# Patient Record
Sex: Female | Born: 1945 | Race: White | Hispanic: No | Smoking: Never smoker
Health system: Southern US, Community
[De-identification: ages and names within clinical notes are randomized; demographics above are authoritative.]

## PROBLEM LIST (undated history)

## (undated) DIAGNOSIS — M1990 Unspecified osteoarthritis, unspecified site: Secondary | ICD-10-CM

## (undated) DIAGNOSIS — F0390 Unspecified dementia without behavioral disturbance: Secondary | ICD-10-CM

## (undated) DIAGNOSIS — I10 Essential (primary) hypertension: Secondary | ICD-10-CM

## (undated) DIAGNOSIS — E079 Disorder of thyroid, unspecified: Secondary | ICD-10-CM

## (undated) DIAGNOSIS — E119 Type 2 diabetes mellitus without complications: Secondary | ICD-10-CM

## (undated) HISTORY — PX: CHOLECYSTECTOMY: SHX55

## (undated) HISTORY — PX: ABDOMINAL HYSTERECTOMY: SHX81

---

## 2014-02-05 ENCOUNTER — Other Ambulatory Visit: Payer: Self-pay

## 2014-02-05 MED ORDER — ALPRAZOLAM 0.25 MG PO TABS: ORAL_TABLET | ORAL | Status: DC

## 2014-02-05 NOTE — Telephone Encounter (Signed)
RX sent to [facility] @ [phone]. Phone number [phone]  

## 2014-02-08 ENCOUNTER — Non-Acute Institutional Stay (SKILLED_NURSING_FACILITY): Payer: Medicare Other | Admitting: Internal Medicine

## 2014-02-08 DIAGNOSIS — R945 Abnormal results of liver function studies: Secondary | ICD-10-CM

## 2014-02-08 DIAGNOSIS — S2220XA Unspecified fracture of sternum, initial encounter for closed fracture: Secondary | ICD-10-CM

## 2014-02-08 DIAGNOSIS — S2249XA Multiple fractures of ribs, unspecified side, initial encounter for closed fracture: Secondary | ICD-10-CM

## 2014-02-08 DIAGNOSIS — F259 Schizoaffective disorder, unspecified: Secondary | ICD-10-CM

## 2014-02-08 DIAGNOSIS — S27329A Contusion of lung, unspecified, initial encounter: Secondary | ICD-10-CM

## 2014-02-08 DIAGNOSIS — R7989 Other specified abnormal findings of blood chemistry: Secondary | ICD-10-CM
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
## 2014-02-13 DIAGNOSIS — S2249XA Multiple fractures of ribs, unspecified side, initial encounter for closed fracture: Secondary | ICD-10-CM | POA: Insufficient documentation

## 2014-02-13 DIAGNOSIS — R7989 Other specified abnormal findings of blood chemistry: Secondary | ICD-10-CM | POA: Insufficient documentation

## 2014-02-13 DIAGNOSIS — S27329A Contusion of lung, unspecified, initial encounter: Secondary | ICD-10-CM

## 2014-02-13 DIAGNOSIS — R945 Abnormal results of liver function studies: Secondary | ICD-10-CM | POA: Insufficient documentation

## 2014-02-13 DIAGNOSIS — F259 Schizoaffective disorder, unspecified: Secondary | ICD-10-CM | POA: Insufficient documentation

## 2014-02-13 DIAGNOSIS — S2220XA Unspecified fracture of sternum, initial encounter for closed fracture: Secondary | ICD-10-CM | POA: Insufficient documentation

## 2014-02-13 HISTORY — DX: Multiple fractures of ribs, unspecified side, initial encounter for closed fracture: S22.49XA

## 2014-02-13 HISTORY — DX: Contusion of lung, unspecified, initial encounter: S27.329A

## 2014-02-15 ENCOUNTER — Non-Acute Institutional Stay (SKILLED_NURSING_FACILITY): Payer: Medicare Other | Admitting: Internal Medicine

## 2014-02-15 DIAGNOSIS — E876 Hypokalemia: Secondary | ICD-10-CM

## 2014-02-17 ENCOUNTER — Non-Acute Institutional Stay (SKILLED_NURSING_FACILITY): Payer: Medicare Other | Admitting: Internal Medicine

## 2014-02-17 DIAGNOSIS — F259 Schizoaffective disorder, unspecified: Secondary | ICD-10-CM

## 2014-02-17 DIAGNOSIS — I10 Essential (primary) hypertension: Secondary | ICD-10-CM

## 2014-02-17 DIAGNOSIS — R945 Abnormal results of liver function studies: Secondary | ICD-10-CM

## 2014-02-17 DIAGNOSIS — E876 Hypokalemia: Secondary | ICD-10-CM

## 2014-02-17 DIAGNOSIS — R7989 Other specified abnormal findings of blood chemistry: Secondary | ICD-10-CM

## 2014-02-17 NOTE — Progress Notes (Signed)
Patient ID: Marie Sandoval, female   DOB: Oct 23, 1946, 68 y.o.   MRN: [id number] Of note-will add hemoglobin A1c to lab work since she is on Seroquel with what appears at times somewhat elevated glucose on metabolic panel most recently 129 and 148  Also will order a BMP and magnesium level next week may be drawn by home health and primary care provider notified of the results
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
## 2014-02-22 DIAGNOSIS — F209 Schizophrenia, unspecified: Secondary | ICD-10-CM

## 2014-02-22 DIAGNOSIS — R0602 Shortness of breath: Secondary | ICD-10-CM

## 2014-02-22 DIAGNOSIS — S2220XA Unspecified fracture of sternum, initial encounter for closed fracture: Secondary | ICD-10-CM

## 2014-02-22 DIAGNOSIS — N189 Chronic kidney disease, unspecified: Secondary | ICD-10-CM

## 2014-02-22 DIAGNOSIS — S2249XA Multiple fractures of ribs, unspecified side, initial encounter for closed fracture: Secondary | ICD-10-CM

## 2014-03-08 DIAGNOSIS — M797 Fibromyalgia: Secondary | ICD-10-CM | POA: Diagnosis present

## 2014-03-08 DIAGNOSIS — N1832 Chronic kidney disease, stage 3b: Secondary | ICD-10-CM | POA: Diagnosis present

## 2014-03-08 DIAGNOSIS — G2581 Restless legs syndrome: Secondary | ICD-10-CM | POA: Diagnosis present

## 2014-03-08 DIAGNOSIS — E039 Hypothyroidism, unspecified: Secondary | ICD-10-CM | POA: Diagnosis present

## 2014-03-08 DIAGNOSIS — J45909 Unspecified asthma, uncomplicated: Secondary | ICD-10-CM | POA: Diagnosis present

## 2014-03-08 DIAGNOSIS — K219 Gastro-esophageal reflux disease without esophagitis: Secondary | ICD-10-CM | POA: Diagnosis present

## 2014-03-08 DIAGNOSIS — G43909 Migraine, unspecified, not intractable, without status migrainosus: Secondary | ICD-10-CM | POA: Diagnosis present

## 2017-11-26 DIAGNOSIS — E1342 Other specified diabetes mellitus with diabetic polyneuropathy: Secondary | ICD-10-CM | POA: Diagnosis present

## 2018-08-18 DIAGNOSIS — G8929 Other chronic pain: Secondary | ICD-10-CM | POA: Diagnosis present

## 2021-01-16 ENCOUNTER — Emergency Department (HOSPITAL_BASED_OUTPATIENT_CLINIC_OR_DEPARTMENT_OTHER): Payer: Medicare HMO

## 2021-01-16 ENCOUNTER — Emergency Department (HOSPITAL_BASED_OUTPATIENT_CLINIC_OR_DEPARTMENT_OTHER)
Admission: EM | Admit: 2021-01-16 | Discharge: 2021-01-16 | Disposition: A | Discharge status: Home / Self Care | Payer: Medicare HMO | Attending: Emergency Medicine | Admitting: Emergency Medicine

## 2021-01-16 ENCOUNTER — Other Ambulatory Visit: Payer: Self-pay

## 2021-01-16 ENCOUNTER — Encounter (HOSPITAL_BASED_OUTPATIENT_CLINIC_OR_DEPARTMENT_OTHER): Payer: Self-pay | Admitting: *Deleted

## 2021-01-16 DIAGNOSIS — E1122 Type 2 diabetes mellitus with diabetic chronic kidney disease: Secondary | ICD-10-CM | POA: Diagnosis not present

## 2021-01-16 DIAGNOSIS — R21 Rash and other nonspecific skin eruption: Secondary | ICD-10-CM | POA: Insufficient documentation

## 2021-01-16 DIAGNOSIS — R600 Localized edema: Secondary | ICD-10-CM

## 2021-01-16 DIAGNOSIS — I129 Hypertensive chronic kidney disease with stage 1 through stage 4 chronic kidney disease, or unspecified chronic kidney disease: Secondary | ICD-10-CM | POA: Diagnosis not present

## 2021-01-16 DIAGNOSIS — Z7984 Long term (current) use of oral hypoglycemic drugs: Secondary | ICD-10-CM | POA: Insufficient documentation

## 2021-01-16 DIAGNOSIS — N189 Chronic kidney disease, unspecified: Secondary | ICD-10-CM | POA: Diagnosis not present

## 2021-01-16 DIAGNOSIS — R609 Edema, unspecified: Secondary | ICD-10-CM

## 2021-01-16 DIAGNOSIS — Z79899 Other long term (current) drug therapy: Secondary | ICD-10-CM | POA: Insufficient documentation

## 2021-01-16 HISTORY — DX: Unspecified osteoarthritis, unspecified site: M19.90

## 2021-01-16 HISTORY — DX: Type 2 diabetes mellitus without complications: E11.9

## 2021-01-16 HISTORY — DX: Essential (primary) hypertension: I10

## 2021-01-16 HISTORY — DX: Disorder of thyroid, unspecified: E07.9

## 2021-01-16 LAB — CBC WITH DIFFERENTIAL/PLATELET
Abs Immature Granulocytes: 0.04 10*3/uL (ref 0.00–0.07)
Basophils Absolute: 0.1 10*3/uL (ref 0.0–0.1)
Basophils Relative: 1 %
Eosinophils Absolute: 0.2 10*3/uL (ref 0.0–0.5)
Eosinophils Relative: 3 %
HCT: 35.5 % — ABNORMAL LOW (ref 36.0–46.0)
Hemoglobin: 11.9 g/dL — ABNORMAL LOW (ref 12.0–15.0)
Immature Granulocytes: 1 %
Lymphocytes Relative: 29 %
Lymphs Abs: 1.4 10*3/uL (ref 0.7–4.0)
MCH: 29.9 pg (ref 26.0–34.0)
MCHC: 33.5 g/dL (ref 30.0–36.0)
MCV: 89.2 fL (ref 80.0–100.0)
Monocytes Absolute: 0.5 10*3/uL (ref 0.1–1.0)
Monocytes Relative: 10 %
Neutro Abs: 2.7 10*3/uL (ref 1.7–7.7)
Neutrophils Relative %: 56 %
Platelets: 169 10*3/uL (ref 150–400)
RBC: 3.98 MIL/uL (ref 3.87–5.11)
RDW: 15.9 % — ABNORMAL HIGH (ref 11.5–15.5)
WBC: 4.9 10*3/uL (ref 4.0–10.5)
nRBC: 0 % (ref 0.0–0.2)

## 2021-01-16 LAB — COMPREHENSIVE METABOLIC PANEL
ALT: 18 U/L (ref 0–44)
AST: 69 U/L — ABNORMAL HIGH (ref 15–41)
Albumin: 3.6 g/dL (ref 3.5–5.0)
Alkaline Phosphatase: 81 U/L (ref 38–126)
Anion gap: 8 (ref 5–15)
BUN: 14 mg/dL (ref 8–23)
CO2: 24 mmol/L (ref 22–32)
Calcium: 8.9 mg/dL (ref 8.9–10.3)
Chloride: 107 mmol/L (ref 98–111)
Creatinine, Ser: 1.68 mg/dL — ABNORMAL HIGH (ref 0.44–1.00)
GFR, Estimated: 32 mL/min — ABNORMAL LOW (ref >60)
Glucose, Bld: 105 mg/dL — ABNORMAL HIGH (ref 70–99)
Potassium: 4 mmol/L (ref 3.5–5.1)
Sodium: 139 mmol/L (ref 135–145)
Total Bilirubin: 0.4 mg/dL (ref 0.3–1.2)
Total Protein: 6.3 g/dL — ABNORMAL LOW (ref 6.5–8.1)

## 2021-01-16 MED ORDER — ACETAMINOPHEN 325 MG PO TABS
650.0000 mg | ORAL_TABLET | Freq: Once | ORAL | Status: AC
  Administered 2021-01-16: 650 mg via ORAL
  Filled 2021-01-16: qty 2

## 2021-01-16 NOTE — ED Triage Notes (Signed)
Swelling to her left foot and ankle. Seen by her MD and told to come here to r.o DVT.

## 2021-01-16 NOTE — ED Notes (Signed)
Patient transported to Ultrasound 

## 2021-01-16 NOTE — ED Provider Notes (Signed)
[facility] EMERGENCY DEPARTMENT Provider Note  CSN: [id number] Arrival date & time: 01/16/21 1618    History Chief Complaint  Patient presents with  . Leg Pain    HPI  Marie Sandoval is a 75 y.o. female sent to the ED from PCP office for evaluation of leg swelling. She first noticed some swelling of her LLE last week, about 4-5 days ago. Started in the foot and gradually extended into her calf. She denies any significant pain but has noticed some red splotches on the leg. She has not had any other symptoms including fever, CP, SOB, N/V/D. She has noted that she bruises easily but is not taking any blood thinners. She does not have bleeding gums or nosebleeds.    Past Medical History:  Diagnosis Date  . Arthritis   . Diabetes mellitus without complication (HCC)   . Hypertension   . Thyroid disease     Past Surgical History:  Procedure Laterality Date  . ABDOMINAL HYSTERECTOMY    . CHOLECYSTECTOMY      No family history on file.  Social History   Tobacco Use  . Smoking status: Never Smoker  . Smokeless tobacco: Never Used  Substance Use Topics  . Alcohol use: Never  . Drug use: Never     Home Medications Prior to Admission medications   Medication Sig Start Date End Date Taking? Authorizing Provider  amLODipine (NORVASC) 5 MG tablet Take 1 tablet by mouth daily. 12/21/20  Yes [provider]  azelastine (OPTIVAR) 0.05 % ophthalmic solution INSTILL 1 DROP INTO EACH EYE TWICE DAILY 08/09/15  Yes [provider]  benzonatate (TESSALON) 100 MG capsule Take 1 capsule by mouth three times daily as needed for cough 11/02/16  Yes [provider]  buPROPion (WELLBUTRIN XL) 150 MG 24 hr tablet Take 1 tablet by mouth every morning. 04/25/20  Yes [provider]  cariprazine (VRAYLAR) capsule Take 1 capsule by mouth every morning. 01/03/21  Yes [provider]  fluticasone (FLONASE) 50 MCG/ACT nasal spray 2 sprays by Each Nare  route 2 times daily. 01/25/17  Yes [provider]  glipiZIDE (GLUCOTROL) 10 MG tablet Take 1 tablet by mouth daily. 02/07/17  Yes [provider]  hydrOXYzine (ATARAX/VISTARIL) 50 MG tablet Take 1 tablet by mouth daily. 08/11/18  Yes [provider]  levothyroxine (SYNTHROID) 75 MCG tablet TAKE 1 TABLET BY MOUTH ONCE DAILY AT 6 AM 10/10/16  Yes [provider]  linagliptin (TRADJENTA) 5 MG TABS tablet Take 1 tablet by mouth daily. 12/21/20  Yes [provider]  lisinopril (ZESTRIL) 5 MG tablet Take 1 tablet by mouth daily. 10/10/16  Yes [provider]  LORazepam (ATIVAN) 0.5 MG tablet 0.5 mg. Take 1 mg in morning.  Take 0.5 mg in evening. 08/15/20  Yes [provider]  metoprolol tartrate (LOPRESSOR) 25 MG tablet Take by mouth. 10/10/16  Yes [provider]  nystatin ointment (MYCOSTATIN) APPLY LIBERALLY TOPICALLY TO AFFECTED AREA FOUR TO FIVE TIMES DAILY 11/09/19  Yes [provider]  omeprazole (PRILOSEC) 40 MG capsule Take 1 capsule by mouth daily. 01/09/21  Yes [provider]  temazepam (RESTORIL) 15 MG capsule Take 1 capsule by mouth at bedtime. 04/26/20  Yes [provider]  traZODone (DESYREL) 50 MG tablet Take 1 tablet by mouth at bedtime. 06/05/20  Yes [provider]  ALPRAZolam Prudy Feeler) 0.25 MG tablet 1 tablet by mouth three times daily as needed x 2 days (use for xanax) 02/05/14  Krell, Claudette T, NP  cetirizine (ZYRTEC) 10 MG tablet Take by mouth.    [provider]  Multiple Vitamin (MULTIVITAMIN) capsule Take 1 capsule by mouth daily with breakfast.    [provider]  Probiotic, Lactobacillus, CAPS Take by mouth.    [provider]     Allergies    Buprenorphine, Gabapentin, Other, Oxycodone-aspirin, and Sulfamethoxazole   Review of Systems   Review of Systems A comprehensive review of systems was completed and negative except as noted in HPI.     Physical Exam BP 113/65   Pulse 65   Temp 98.3 F (36.8 C) (Oral)   Resp 20   Ht 5\' 7"  (1.702 m)   Wt 80.7 kg   SpO2 98%   BMI 27.88 kg/m   Physical Exam Vitals and nursing note reviewed.  Constitutional:      Appearance: Normal appearance.  HENT:     Head: Normocephalic and atraumatic.     Nose: Nose normal.     Mouth/Throat:     Mouth: Mucous membranes are moist.  Eyes:     Extraocular Movements: Extraocular movements intact.     Conjunctiva/sclera: Conjunctivae normal.  Cardiovascular:     Rate and Rhythm: Normal rate.  Pulmonary:     Effort: Pulmonary effort is normal.     Breath sounds: Normal breath sounds.  Abdominal:     General: Abdomen is flat.     Palpations: Abdomen is soft.     Tenderness: There is no abdominal tenderness.  Musculoskeletal:        General: Normal range of motion.     Cervical back: Neck supple.     Left lower leg: Edema present.     Comments: 1+ edema of LLE to the mid-shin  Skin:    General: Skin is warm and dry.     Comments: Occasional well demarkated, red macular rash, non-blanching on BLE and BUE.   Neurological:     General: No focal deficit present.     Mental Status: She is alert.  Psychiatric:        Mood and Affect: Mood normal.      ED Results / Procedures / Treatments   Labs (all labs ordered are listed, but only abnormal results are displayed) Labs Reviewed  COMPREHENSIVE METABOLIC PANEL - Abnormal; Notable for the following components:      Result Value   Glucose, Bld 105 (*)    Creatinine, Ser 1.68 (*)    Total Protein 6.3 (*)    AST 69 (*)    GFR, Estimated 32 (*)    All other components within normal limits  CBC WITH DIFFERENTIAL/PLATELET - Abnormal; Notable for the following components:   Hemoglobin 11.9 (*)    HCT 35.5 (*)    RDW 15.9 (*)    All other components within normal limits    EKG None   Radiology Venous Img Lower  Left (DVT Study)  Result Date: 01/16/2021 CLINICAL DATA:   Left lower extremity swelling EXAM: LEFT LOWER EXTREMITY VENOUS DOPPLER ULTRASOUND TECHNIQUE: Gray-scale sonography with compression, as well as color and duplex ultrasound, were performed to evaluate the deep venous system(s) from the level of the common femoral vein through the popliteal and proximal calf veins. COMPARISON:  None. FINDINGS: VENOUS Normal compressibility of the common femoral, superficial femoral, and popliteal veins, as well as the visualized calf veins. Visualized portions of profunda femoral vein and great saphenous vein unremarkable. No filling defects to suggest DVT  on grayscale or color Doppler imaging. Doppler waveforms show normal direction of venous flow, normal respiratory plasticity and response to augmentation. Limited views of the contralateral common femoral vein are unremarkable. OTHER None. Limitations: none IMPRESSION: Negative. Electronically Signed   By: Charlett Nose M.D.   On: 01/16/2021 18:42    Procedures Procedures  Medications Ordered in the ED Medications  acetaminophen (TYLENOL) tablet 650 mg (650 mg Oral Given 01/16/21 1911)     MDM Rules/Calculators/A&P MDM Patient with unilateral LE edema, sent to rule out DVT. Doppler was neg. Will check labs to see if her CKD has worsened or if she is thrombocytopenic to explain her bruising. Patient requesting APAP for headache.   ED Course  I have reviewed the triage vital signs and the nursing notes.  Pertinent labs & imaging results that were available during my care of the patient were reviewed by me and considered in my medical decision making (see chart for details).  Clinical Course as of 01/16/21 2040  Mon Jan 16, 2021  1927 CBC is unremarkable. Normal platelet count.  [CS]  2038 CMP at baseline. No acute cause of peripheral edema found. Recommend outpatient nephrology or PCP follow up for further management.  She does not tolerate wearing compression stockings.  [CS]    Clinical Course User  Index [CS] Pollyann Savoy, MD    Final Clinical Impression(s) / ED Diagnoses Final diagnoses:  Peripheral edema    Rx / DC Orders ED Discharge Orders    None       Pollyann Savoy, MD 01/16/21 2040

## 2021-03-11 DIAGNOSIS — F419 Anxiety disorder, unspecified: Secondary | ICD-10-CM | POA: Diagnosis present

## 2021-03-12 DIAGNOSIS — G2571 Drug induced akathisia: Secondary | ICD-10-CM | POA: Insufficient documentation

## 2021-03-13 DIAGNOSIS — E119 Type 2 diabetes mellitus without complications: Secondary | ICD-10-CM

## 2021-03-13 DIAGNOSIS — J449 Chronic obstructive pulmonary disease, unspecified: Secondary | ICD-10-CM | POA: Diagnosis present

## 2021-03-14 NOTE — Progress Notes (Signed)
 [facility] HEALTH [facility] Physical Therapy - Initial Evaluation  Patient Name:  Marie Sandoval Date of Birth:  1946/08/09  Today's Date: Mar 14, 2021  Assessment / Plan   Clinical Assessment: Pt admitted to [facility] with previous history of anxiety disorder, antipsychotic induced akathisia, and schizoaffective disorder. Pt was apparently living alone, uses quad cane or walker for amb. Pt presenlty not using the quad cane correctly so PT instructed pt to use RW for all ambulation and RW was issued to pt to use while [facility]. Pt's gait was steady with RW, pt only req'd occassional cues to increase bilat heel strike and foot clearance. Pt has no antalgic gait and only reported pain when asked by PT. Pt c/o mild pain L hip with sit to stand. Pt may benefit from PT for instruction in HEP for L hip/ low back to maximize functional mobility Current Level of Assistance required for Functional Mobility: Supervision (verbal cues/ setup/ safety) (SBA) Treatment Diagnosis: pain L hip Prognosis: Good  Recommendations  Rehab Needs at Next Level of Care: Continued skilled PT at next level of care Anticipated Intensity of Rehab at Next Level of Care: Multiple times per week Anticipated Caregiver Needs at Next Level of Care: Full time indirect Recommendations provided are based on today's PT assessment, however, final discharge decisions are based on input from the interdisciplinary care team and may differ from these recommendations. Additionally, today's recommendations may change due to changes in acute medical needs.  Pertinent Information: Spoke with nursing.  Nursing cleared patient to participate in therapy.  At end of session, patient in bed needs in reach, and nurse aware.  Family present for session? no  Problem List Patient Active Problem List  Diagnosis  . Anxiety disorder, unspecified type  . Schizoaffective disorder (*)  . Antipsychotic-induced akathisia  . Schizoaffective  disorder, depressive type (*)  . COPD (chronic obstructive pulmonary disease) (*)  . Diabetes mellitus (*)  . Hypertension  . Stage 3 chronic kidney disease (*)  . Seasonal allergies  . Gastroesophageal reflux disease without esophagitis  . Hyponatremia   History Past Medical History:  Diagnosis Date  . COPD (chronic obstructive pulmonary disease) (*)   . Diabetes mellitus (*)   . Disease of thyroid gland   . Hypertension   . Stage 3 chronic kidney disease (*)    Past Surgical History:  Procedure Laterality Date  . Cholecystectomy    . Hysterectomy     Subjective  Pt lying in bed, agreeable to PT eval   Objective   Home Living Type of Home: Apartment Home Layout: One level (no STE) Home Equipment: 4 wheeled walker;Quad cane  Prior Function Level of Independence: Modified Independent with functional transfers;Modified Independent with ambulation Assistive devices for Ambulation: Modfied Independent: Quad cane Lives With: Alone Dominant Hand: Right  Sensation Sensation: Within functional limits   Extremity Assessment  AROM AROM Assessment: WFL       Strength Comments: B LEs grossly 4+/5, B UEs WFL      Mobility Bed Mobility Rolling: Independent Supine to Sit: Independent Sit to Supine: Independent   Transfers Sit to/from Stand:  (SBA with quad cane)   Gait/Ambulation Pattern: Decreased step length - L;Decreased step length - R;Decreased cadence (decreased B foot clearance, decreased B heel strike) Gait Assistance:  (SBA) Assistive Device: Small base quad cane Ambulation Distance (ft) 1: 100 Ambulation Distance (ft) 2: 80 Comments: Pt amb second time with RW due to pt not using quad cane correctly,  tilting it, despite PT instructions. PT recommended pt use RW as it gave her more support, gait was steadier and it may help to decrease pain L hip/back. Pt verbalized understanding. PT issued pt a RW to use while in [facility]. Pt states she has RW at home        Balance Sitting - Static: Supervision Sitting - Dynamic: Supervision Standing - Static:  (SBA with RW) Standing - Dynamic:  (SBA with RW)   FunctionalTests AM-PAC Basic Mobility Raw Score (out of 24): 20  Goals      Physical Therapy Care Plan (Active)    Problem: Mobility - Impaired    Dates: Start: 03/14/21      Goal: Bed Mobility    Dates: Start: 03/14/21   Expected End: 03/28/21      Description: Patient will perform bed mobility with no assistance. Sharlet Macario Masters, PT 03/14/2021 / 12:01 PM        Goal: Transfers    Dates: Start: 03/14/21   Expected End: 03/28/21      Description: Patient will perform basic transfer with LRAD with no assistance. Sharlet Macario Masters, PT 03/14/2021 / 12:01 PM        Goal: Ambulation    Dates: Start: 03/14/21   Expected End: 03/28/21      Description: Patient will ambulate 100 feet with LRAD with no assistance. Sharlet Macario Masters, PT 03/14/2021 / 12:01 PM          Problem: Other (edit field to enter specific information)    Dates: Start: 03/14/21      Goal: Home exercise program    Dates: Start: 03/14/21   Expected End: 03/28/21      Description: Patient/caregiver will demonstrate home exercise program with no assistance. Sharlet Macario Masters, PT 03/14/2021 / 12:01 PM           Key (I=independent, ModI=modified independent, S=supervision, CGA=contact guard assist, Min=minimal assist, Mod=moderate assist, Max=maximal assist, D= dependent)   Evaluation / Treatment Time  Today's Evaluation/Treatment: 1115 - 1145 Total Time: 30 min Treatment Day: 1   Needs During Acute Hospitalization: Continued skilled PT Treatment/Interventions: Bed mobility;Transfer training;Gait training;Endurance training;Therapeutic exercise/strengthening;Functional mobility training;Patient/support person education;Balance training PT Frequency: 2x/wk Plan of Care was discussed with and agreed upon by Patient/Caregiver: Yes Charges  Total Time  Code Treatment Minutes: 10  Evaluation Charges $ PT Evaluation: High Complex Therapeutic Charges $ Gait training: 1 unit  Sharlet Macario Masters, PT 03/14/2021 12:02 PM

## 2021-03-16 NOTE — Progress Notes (Signed)
 [facility] HEALTH [facility] Physical Therapy - Treatment  Patient Name:  Brytani Voth [facility] Date of Birth:  03-Jun-1946  Today's Date: Mar 16, 2021  Assessment   Clinical Assessment: Patient is unable to progress toward goals this date due to back pain of 8/10.  She reports chronic back pain, but it is much worse today.  Patient able to perform toileting on her own with standby supervision.  Patient would benefit from continued PT to maximize functional mobility. Current Level of Assistance Required for Functional Mobility: Supervision (verbal cues/ setup/ safety);   Functional Limitations: Decline in ambulation;Decline in safety awareness;Fall risk;Decreased functional mobility;Generalized weakness;Decreased activity tolerance Prognosis:    Plan for Acute Hospitalization  Needs During Acute Hospitalization: Continued skilled PT Treatment/Interventions: Bed mobility;Transfer training;Gait training;Endurance training;Therapeutic exercise/strengthening;Functional mobility training;Patient/support person education;Balance training PT Frequency: 2x/wk    Recommendations for Discharge     Rehab Needs at Next Level of Care: Continued skilled PT at next level of care Anticipated Intensity of Rehab at Next Level of Care: Multiple times per week Anticipated Caregiver Needs at Next Level of Care: Full time indirect DME Equipment Recommendations: Rolling walker Additional Considerations for Discharge: Limited safety awareness;Generalized weakness  Subjective  Patient is sitting at EOB.  She c/o back pain of 8/10.   Pertinent Information: Spoke with nursing.  Nursing cleared patient to participate in therapy.  At end of session, patient in bed with needs in reach, and nurse aware.  Family present for session? No   Objective   Precautions  Other Precautions: fall risk  Pain 8/10 back pain reported.  Mobility Bed Mobility Supine to Sit: Independent Sit to Supine: Independent    Transfers Sit to/from Stand: Supervision   Gait/Ambulation Pattern: Decreased cadence;Decreased step length - L;Decreased step length - R;Decreased heel stike - L;Decreased heel strike - R Gait Assistance: Supervision;Minimal (CGA) Assistive Device: Rolling walker Ambulation Distance (ft) 1: 60 Ambulation Distance (ft) 2: 60    Functional Tests AM-PAC Basic Mobility Turning from your back to your side while in a flat bed without using bedrails?: None Moving from lying on your back to sitting on the side of a flat bed without using bedrails?: None Moving to and from a bed to a chair (including a wheelchair)?: A little Standing up from a chair using your arms (e.g., wheelchair, or bedside chair)? : A little Walking in hospital room?: A little Climbing 3-5 steps with a railing?: A little AM-PAC Basic Mobility Raw Score (out of 24): 20  Goals     Physical Therapy Care Plan (Active)    Problem: Mobility - Impaired    Dates: Start: 03/14/21      Goal: Transfers    Dates: Start: 03/14/21   Expected End: 03/28/21      Description: Patient will perform basic transfer with LRAD with no assistance. Sharlet Macario Masters, PT 03/14/2021 / 12:01 PM      Outcomes    Date/Time User Outcome   03/15/21 1935 [place], RN Progressing   03/14/21 1931 Laymon Potters, RN Progressing       Goal: Ambulation    Dates: Start: 03/14/21   Expected End: 03/28/21      Description: Patient will ambulate 100 feet with LRAD with no assistance. Sharlet Macario Masters, PT 03/14/2021 / 12:01 PM      Outcomes    Date/Time User Outcome   03/15/21 1935 [place], RN Progressing   03/14/21 1931 Laymon Potters, RN Progressing  Problem: Other (edit field to enter specific information)    Dates: Start: 03/14/21      Goal: Home exercise program    Dates: Start: 03/14/21   Expected End: 03/28/21      Description: Patient/caregiver will demonstrate home exercise program with no assistance. Sharlet Macario Masters, PT 03/14/2021 / 12:01 PM      Outcomes    Date/Time User Outcome   03/15/21 1935 [place], RN Progressing   03/14/21 1931 Laymon Potters, RN Progressing          Key (I=independent, ModI=modified independent, S=supervision, CGA=contact guard assist, Min=minimal assist, Mod=moderate assist, Max=maximal assist, D= dependent)   Plan of Care was discussed with and agreed upon by Patient/Caregiver:    Treatment Time  Today's Treatment: 0912 - 0935 Total Time: 23 min Treatment Day: 2   Charges  Total Time Code Treatment Minutes: 23  Therapeutic Charges $ Gait training: 1 unit $ Therapeutic Activity: 1 unit  Long, PTA 03/16/2021 9:44 AM

## 2022-08-02 IMAGING — US US EXTREM LOW VENOUS*L*
1 series · 14 of 24 positions shown · non-contrast
Comparison: None.

CLINICAL DATA: Left lower extremity swelling

EXAM:
LEFT LOWER EXTREMITY VENOUS DOPPLER ULTRASOUND
TECHNIQUE: Gray-scale sonography with compression, as well as color and duplex
ultrasound, were performed to evaluate the deep venous system(s)
from the level of the common femoral vein through the popliteal and
proximal calf veins.

[Series 1: us extrem low venous*left* · 14 of 37 slices shown]
[im 1/37]
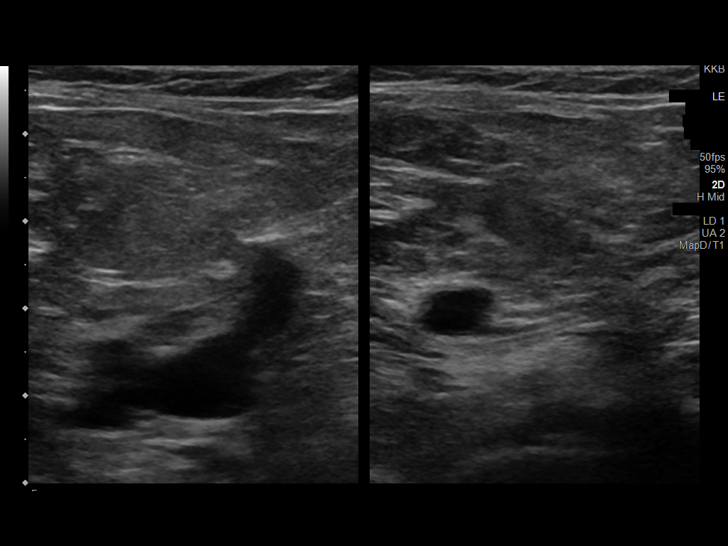
[im 4/37]
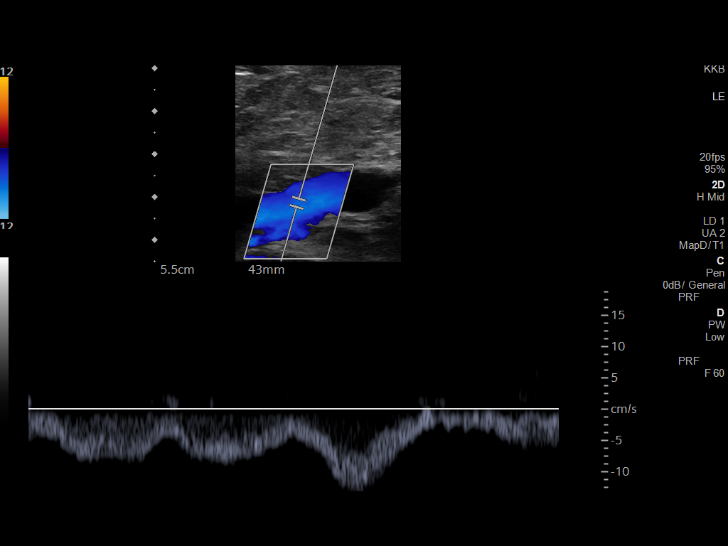
[im 7/37]
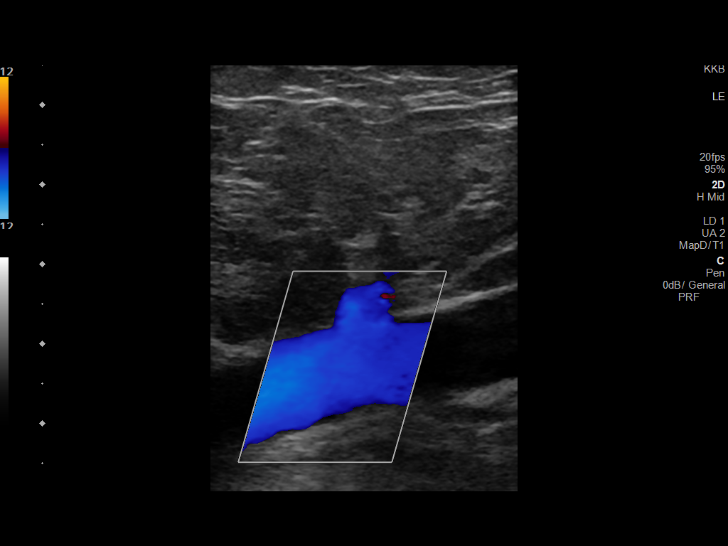
[im 10/37]
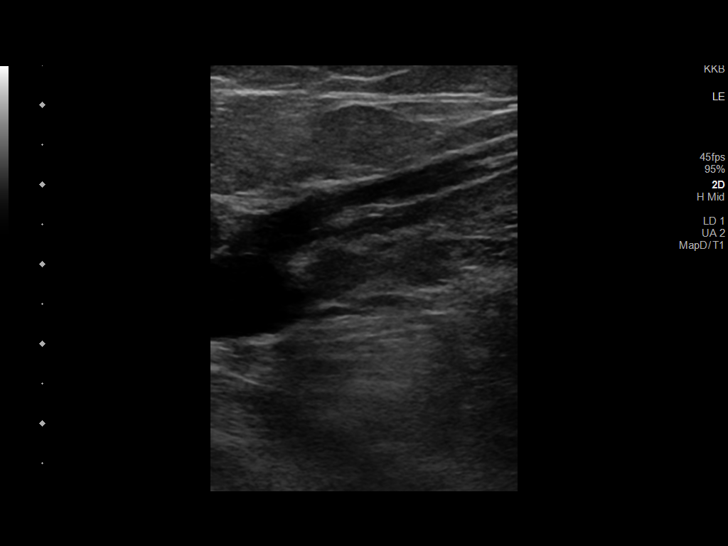
[im 11/37]
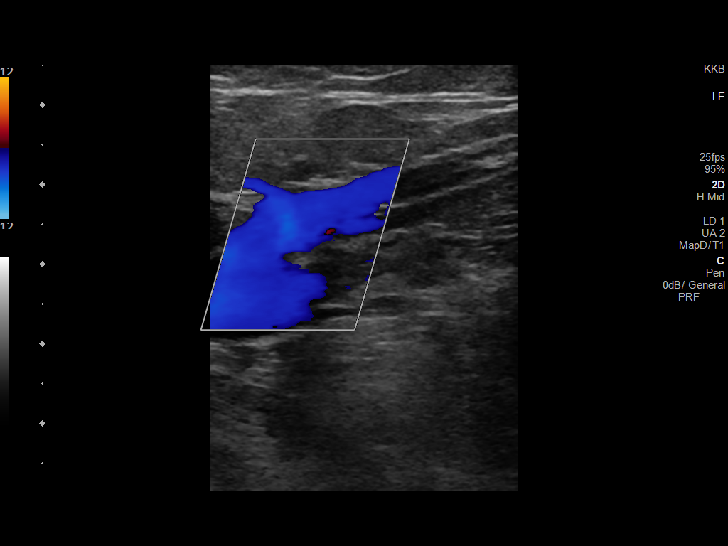
[im 15/37]
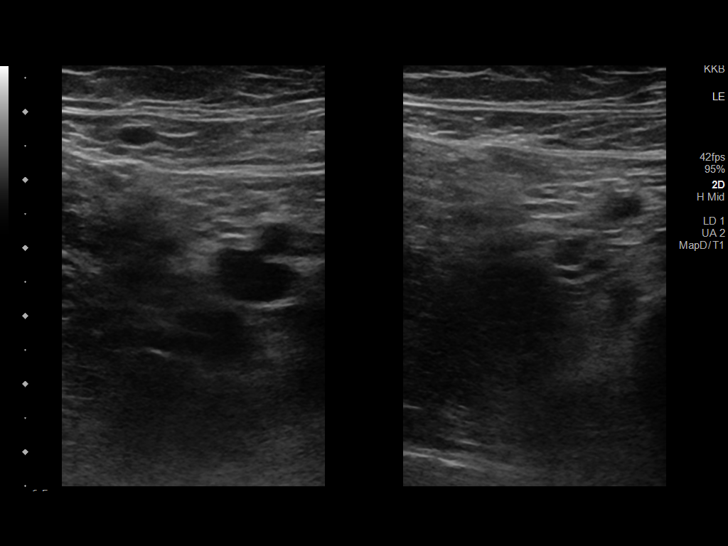
[im 18/37]
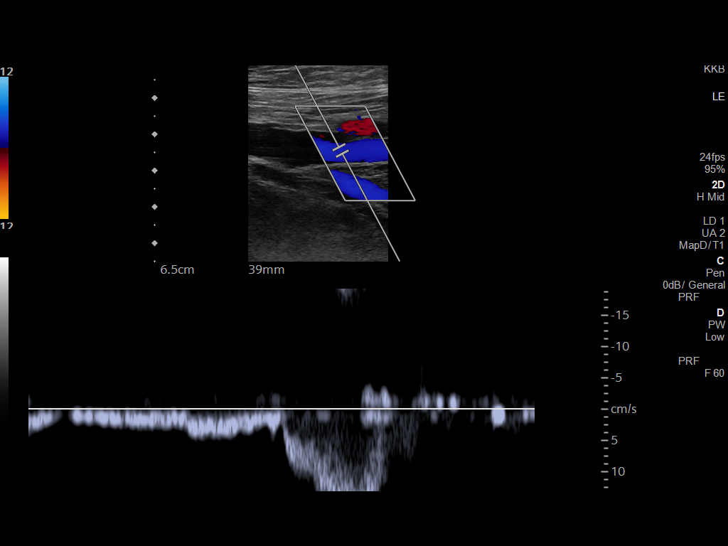
[im 19/37]
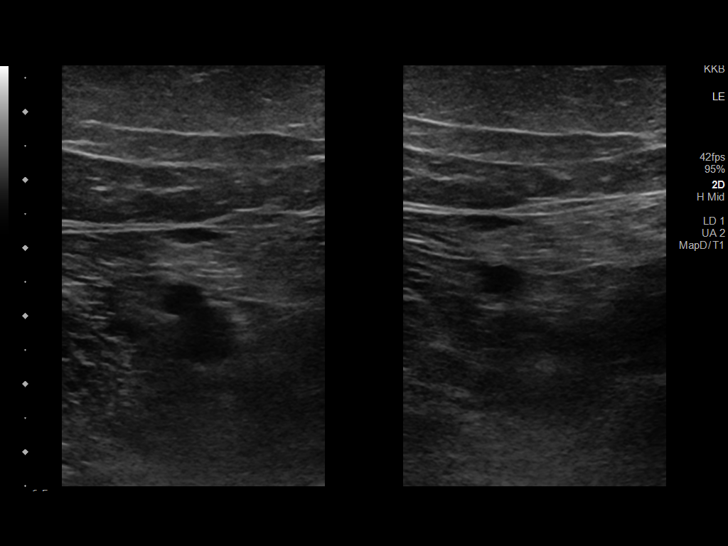
[im 22/37]
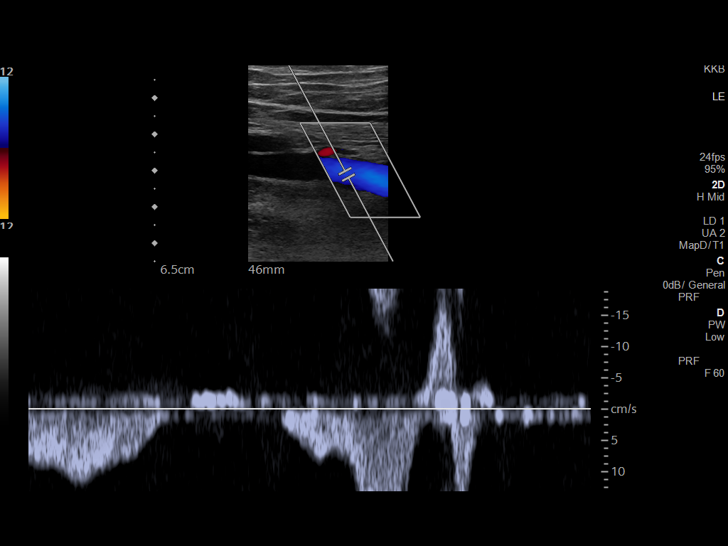
[im 26/37]
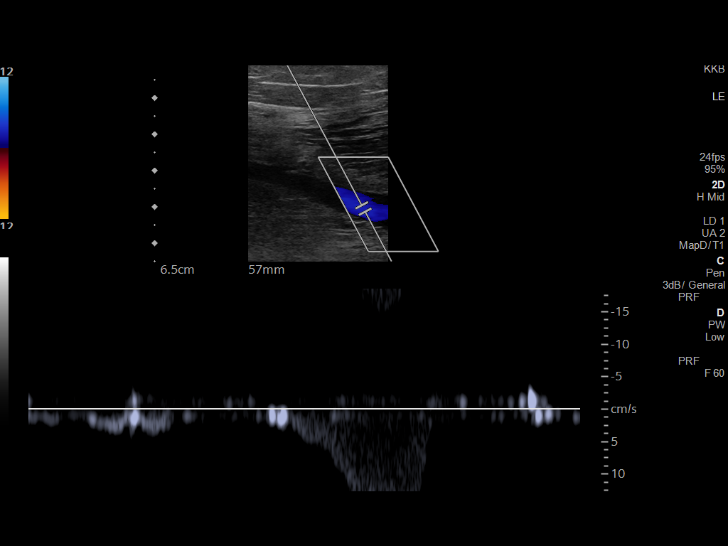
[im 29/37]
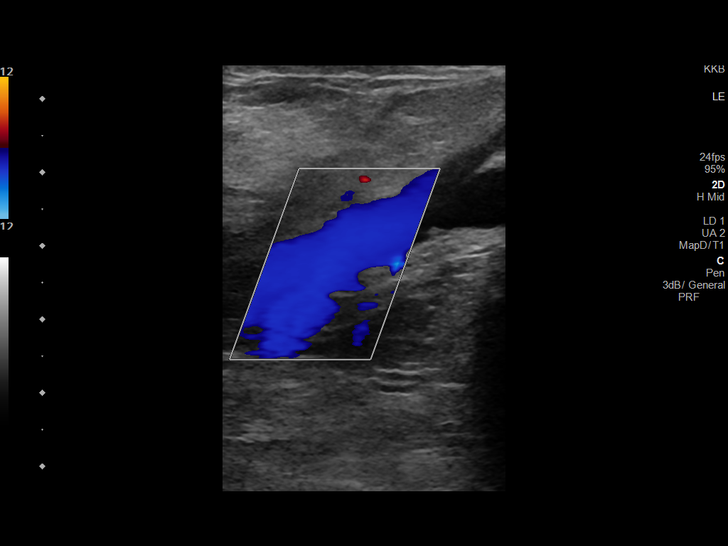
[im 30/37]
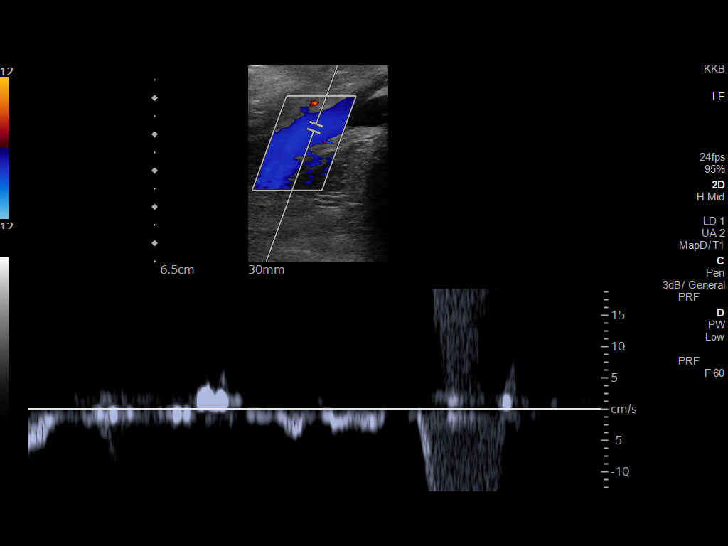
[im 33/37]
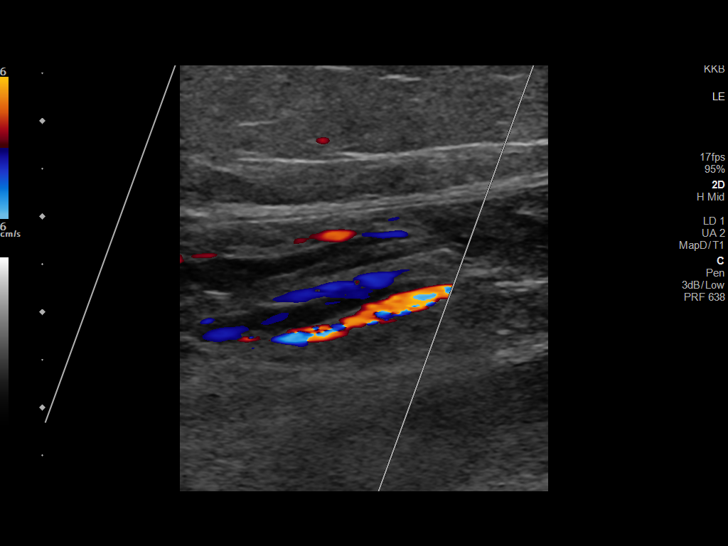
[im 37/37]
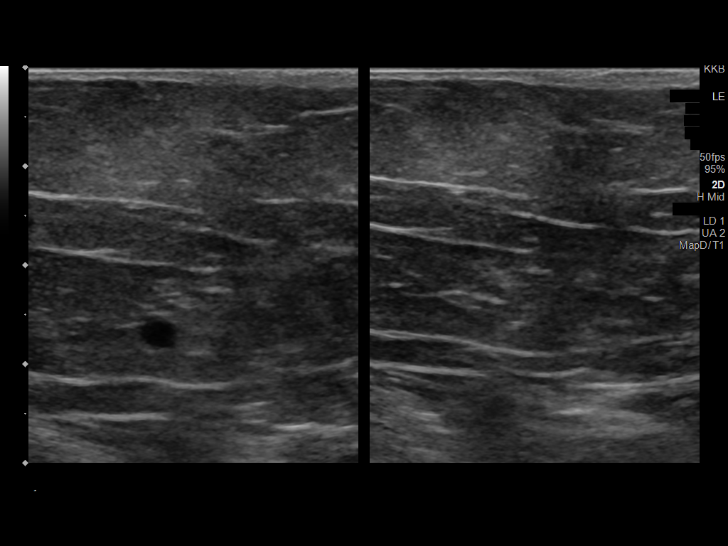

[14 of 24 positions shown; findings below may reference images not displayed]

FINDINGS: VENOUS

Normal compressibility of the common femoral, superficial femoral,
and popliteal veins, as well as the visualized calf veins.
Visualized portions of profunda femoral vein and great saphenous
vein unremarkable. No filling defects to suggest DVT on grayscale or
color Doppler imaging. Doppler waveforms show normal direction of
venous flow, normal respiratory plasticity and response to
augmentation.

Limited views of the contralateral common femoral vein are
unremarkable.

OTHER

None.

Limitations: none
IMPRESSION: Negative.

## 2022-08-16 DIAGNOSIS — F0390 Unspecified dementia without behavioral disturbance: Secondary | ICD-10-CM | POA: Diagnosis present

## 2023-04-21 ENCOUNTER — Encounter (HOSPITAL_COMMUNITY): Payer: Self-pay | Admitting: Emergency Medicine

## 2023-04-21 ENCOUNTER — Other Ambulatory Visit: Payer: Self-pay

## 2023-04-21 ENCOUNTER — Emergency Department (HOSPITAL_COMMUNITY): Payer: Medicare HMO

## 2023-04-21 ENCOUNTER — Emergency Department (HOSPITAL_COMMUNITY)
Admission: EM | Admit: 2023-04-21 | Discharge: 2023-04-21 | Disposition: A | Discharge status: Home / Self Care | Payer: Medicare HMO | Attending: Emergency Medicine | Admitting: Emergency Medicine

## 2023-04-21 DIAGNOSIS — Z79899 Other long term (current) drug therapy: Secondary | ICD-10-CM | POA: Diagnosis not present

## 2023-04-21 DIAGNOSIS — R0789 Other chest pain: Secondary | ICD-10-CM | POA: Insufficient documentation

## 2023-04-21 DIAGNOSIS — Z7984 Long term (current) use of oral hypoglycemic drugs: Secondary | ICD-10-CM | POA: Diagnosis not present

## 2023-04-21 DIAGNOSIS — E1165 Type 2 diabetes mellitus with hyperglycemia: Secondary | ICD-10-CM | POA: Diagnosis not present

## 2023-04-21 DIAGNOSIS — I10 Essential (primary) hypertension: Secondary | ICD-10-CM | POA: Diagnosis not present

## 2023-04-21 DIAGNOSIS — R002 Palpitations: Secondary | ICD-10-CM | POA: Insufficient documentation

## 2023-04-21 LAB — CBC
HCT: 38.9 % (ref 36.0–46.0)
Hemoglobin: 12.9 g/dL (ref 12.0–15.0)
MCH: 32.4 pg (ref 26.0–34.0)
MCHC: 33.2 g/dL (ref 30.0–36.0)
MCV: 97.7 fL (ref 80.0–100.0)
Platelets: 105 10*3/uL — ABNORMAL LOW (ref 150–400)
RBC: 3.98 MIL/uL (ref 3.87–5.11)
RDW: 13.7 % (ref 11.5–15.5)
WBC: 4.6 10*3/uL (ref 4.0–10.5)
nRBC: 0 % (ref 0.0–0.2)

## 2023-04-21 LAB — BASIC METABOLIC PANEL
Anion gap: 8 (ref 5–15)
BUN: 21 mg/dL (ref 8–23)
CO2: 22 mmol/L (ref 22–32)
Calcium: 8.7 mg/dL — ABNORMAL LOW (ref 8.9–10.3)
Chloride: 105 mmol/L (ref 98–111)
Creatinine, Ser: 1.86 mg/dL — ABNORMAL HIGH (ref 0.44–1.00)
GFR, Estimated: 28 mL/min — ABNORMAL LOW (ref >60)
Glucose, Bld: 167 mg/dL — ABNORMAL HIGH (ref 70–99)
Potassium: 4.4 mmol/L (ref 3.5–5.1)
Sodium: 135 mmol/L (ref 135–145)

## 2023-04-21 LAB — TROPONIN I (HIGH SENSITIVITY)
Troponin I (High Sensitivity): 6 ng/L (ref <18)
Troponin I (High Sensitivity): 7 ng/L (ref <18)

## 2023-04-21 LAB — CBG MONITORING, ED: Glucose-Capillary: 158 mg/dL — ABNORMAL HIGH (ref 70–99)

## 2023-04-21 MED ORDER — LIDOCAINE 5 % EX PTCH: 1.0000 | MEDICATED_PATCH | CUTANEOUS | 0 refills | Status: DC

## 2023-04-21 MED ORDER — FENTANYL CITRATE PF 50 MCG/ML IJ SOSY
50.0000 ug | PREFILLED_SYRINGE | Freq: Once | INTRAMUSCULAR | Status: AC
  Administered 2023-04-21: 50 ug via INTRAVENOUS
  Filled 2023-04-21: qty 1

## 2023-04-21 NOTE — ED Triage Notes (Signed)
Pt BIB [facility] from [facility].  Pt c/o chest pain the center of her chest.EMS was told patient fell past day ago. It was a mechanical fall, but she did not hit her head. She was assisted to the floor. Pt reported to EMS she feels like she has been hit in the chest. Pt said it started yesterday and it resolve and it started 1200 today.

## 2023-04-21 NOTE — ED Notes (Signed)
Assumed care of patient ambulatory to restroom with assist . Patient here from care facility after falling to floor sevberal days ago and now c/o intermittent sternal cp . Patient pending repeat labs and xr results . Patient a/o x 4 respirations even and non labored.

## 2023-04-21 NOTE — ED Provider Notes (Signed)
[facility] EMERGENCY DEPARTMENT AT [facility] Provider Note   CSN: [id number] Arrival date & time: 04/21/23  1712     History {Add pertinent medical, surgical, social history, OB history to HPI:1} Chief Complaint  Patient presents with   Chest Pain    Marie Sandoval is a 77 y.o. female.   Chest Pain    Patient has a history of diabetes hypertension, thyroid disease who presents to the ED with chest pain.  Patient states she had a fall a couple days ago.  She started having pain in her chest after this fall.  Patient states she feels like she has been hit in her chest.  It is on the left side somewhat in the center.  It increases with palpation.  She denies any shortness of breath.  No leg swelling.  No headache.  Home Medications Prior to Admission medications   Medication Sig Start Date End Date Taking? Authorizing Provider  ALPRAZolam Prudy Feeler) 0.25 MG tablet 1 tablet by mouth three times daily as needed x 2 days (use for xanax) 02/05/14   Krell, Claudette T, NP  amLODipine (NORVASC) 5 MG tablet Take 1 tablet by mouth daily. 12/21/20   [provider]  azelastine (OPTIVAR) 0.05 % ophthalmic solution INSTILL 1 DROP INTO EACH EYE TWICE DAILY 08/09/15   [provider]  benzonatate (TESSALON) 100 MG capsule Take 1 capsule by mouth three times daily as needed for cough 11/02/16   [provider]  buPROPion (WELLBUTRIN XL) 150 MG 24 hr tablet Take 1 tablet by mouth every morning. 04/25/20   [provider]  cariprazine (VRAYLAR) capsule Take 1 capsule by mouth every morning. 01/03/21   [provider]  cetirizine (ZYRTEC) 10 MG tablet Take by mouth.    [provider]  fluticasone (FLONASE) 50 MCG/ACT nasal spray 2 sprays by Each Nare route 2 times daily. 01/25/17   [provider]  glipiZIDE (GLUCOTROL) 10 MG tablet Take 1 tablet by mouth daily. 02/07/17   [provider]  hydrOXYzine (ATARAX/VISTARIL) 50 MG  tablet Take 1 tablet by mouth daily. 08/11/18   [provider]  levothyroxine (SYNTHROID) 75 MCG tablet TAKE 1 TABLET BY MOUTH ONCE DAILY AT 6 AM 10/10/16   [provider]  linagliptin (TRADJENTA) 5 MG TABS tablet Take 1 tablet by mouth daily. 12/21/20   [provider]  lisinopril (ZESTRIL) 5 MG tablet Take 1 tablet by mouth daily. 10/10/16   [provider]  LORazepam (ATIVAN) 0.5 MG tablet 0.5 mg. Take 1 mg in morning.  Take 0.5 mg in evening. 08/15/20   [provider]  metoprolol tartrate (LOPRESSOR) 25 MG tablet Take by mouth. 10/10/16   [provider]  Multiple Vitamin (MULTIVITAMIN) capsule Take 1 capsule by mouth daily with breakfast.    [provider]  nystatin ointment (MYCOSTATIN) APPLY LIBERALLY TOPICALLY TO AFFECTED AREA FOUR TO FIVE TIMES DAILY 11/09/19   [provider]  omeprazole (PRILOSEC) 40 MG capsule Take 1 capsule by mouth daily. 01/09/21   [provider]  Probiotic, Lactobacillus, CAPS Take by mouth.    [provider]  temazepam (RESTORIL) 15 MG capsule Take 1 capsule by mouth at bedtime. 04/26/20   [provider]  traZODone (DESYREL) 50 MG tablet Take 1 tablet by mouth at bedtime. 06/05/20   [provider]      Allergies    Buprenorphine, Gabapentin, Other, Oxycodone-aspirin, and Sulfamethoxazole    Review of Systems   Review  of Systems  Cardiovascular:  Positive for chest pain.    Physical Exam Updated Vital Signs BP 129/75 (BP Location: Right Arm)   Pulse 63   Temp 98 F (36.7 C) (Oral)   Resp 12   Ht 1.676 m (5\' 6" )   Wt 68 kg   SpO2 96%   BMI 24.21 kg/m  Physical Exam Vitals and nursing note reviewed.  Constitutional:      General: She is not in acute distress.    Appearance: She is well-developed.  HENT:     Head: Normocephalic and atraumatic.     Right Ear: External ear normal.     Left Ear: External ear normal.  Eyes:     General: No  scleral icterus.       Right eye: No discharge.        Left eye: No discharge.     Conjunctiva/sclera: Conjunctivae normal.  Neck:     Trachea: No tracheal deviation.  Cardiovascular:     Rate and Rhythm: Normal rate and regular rhythm.  Pulmonary:     Effort: Pulmonary effort is normal. No respiratory distress.     Breath sounds: Normal breath sounds. No stridor. No wheezing or rales.  Chest:     Chest wall: Tenderness present.  Abdominal:     General: Bowel sounds are normal. There is no distension.     Palpations: Abdomen is soft.     Tenderness: There is no abdominal tenderness. There is no guarding or rebound.  Musculoskeletal:        General: No tenderness or deformity.     Cervical back: Neck supple.  Skin:    General: Skin is warm and dry.     Findings: No rash.  Neurological:     General: No focal deficit present.     Mental Status: She is alert.     Cranial Nerves: No cranial nerve deficit, dysarthria or facial asymmetry.     Sensory: No sensory deficit.     Motor: No abnormal muscle tone or seizure activity.     Coordination: Coordination normal.  Psychiatric:        Mood and Affect: Mood normal.     ED Results / Procedures / Treatments   Labs (all labs ordered are listed, but only abnormal results are displayed) Labs Reviewed  BASIC METABOLIC PANEL  CBC  CBG MONITORING, ED  TROPONIN I (HIGH SENSITIVITY)    EKG None  Radiology No results found.  Procedures Procedures  {Document cardiac monitor, telemetry assessment procedure when appropriate:1}  Medications Ordered in ED Medications  fentaNYL (SUBLIMAZE) injection 50 mcg (has no administration in time range)    ED Course/ Medical Decision Making/ A&P   {   Click here for ABCD2, HEART and other calculatorsREFRESH Note before signing :1}                          Medical Decision Making Amount and/or Complexity of Data Reviewed Labs: ordered. Radiology: ordered.  Risk Prescription drug  management.   ***  {Document critical care time when appropriate:1} {Document review of labs and clinical decision tools ie heart score, Chads2Vasc2 etc:1}  {Document your independent review of radiology images, and any outside records:1} {Document your discussion with family members, caretakers, and with consultants:1} {Document social determinants of health affecting pt's care:1} {Document your decision making why or why not admission, treatments were needed:1} Final Clinical Impression(s) / ED Diagnoses Final diagnoses:  None  Rx / DC Orders ED Discharge Orders     None

## 2023-04-21 NOTE — ED Notes (Signed)
[facility] called to transfer patient back to [facility] in [facility]

## 2023-04-21 NOTE — Discharge Instructions (Signed)
Take Tylenol for pain.  You can also take the Lidoderm to help with the chest wall pain.  Follow-up with your doctor to be rechecked if the symptoms are not improving.  Your chest soreness may persist for a week or 2

## 2023-04-21 NOTE — ED Notes (Signed)
Report called to Delores RN at [facility] in [place] pt sent back to facility via PTAR . PTAR crew given dispo documents and report prior to departure

## 2023-09-01 ENCOUNTER — Encounter (HOSPITAL_COMMUNITY): Payer: Self-pay

## 2023-09-01 ENCOUNTER — Emergency Department (HOSPITAL_COMMUNITY): Payer: Medicare HMO

## 2023-09-01 ENCOUNTER — Other Ambulatory Visit: Payer: Self-pay

## 2023-09-01 ENCOUNTER — Emergency Department (HOSPITAL_COMMUNITY)
Admission: EM | Admit: 2023-09-01 | Discharge: 2023-09-01 | Disposition: A | Discharge status: Home / Self Care | Payer: Medicare HMO | Attending: Emergency Medicine | Admitting: Emergency Medicine

## 2023-09-01 DIAGNOSIS — W19XXXA Unspecified fall, initial encounter: Secondary | ICD-10-CM

## 2023-09-01 DIAGNOSIS — R9431 Abnormal electrocardiogram [ECG] [EKG]: Secondary | ICD-10-CM | POA: Diagnosis not present

## 2023-09-01 DIAGNOSIS — M542 Cervicalgia: Secondary | ICD-10-CM | POA: Insufficient documentation

## 2023-09-01 DIAGNOSIS — W010XXA Fall on same level from slipping, tripping and stumbling without subsequent striking against object, initial encounter: Secondary | ICD-10-CM | POA: Insufficient documentation

## 2023-09-01 DIAGNOSIS — S0011XA Contusion of right eyelid and periocular area, initial encounter: Secondary | ICD-10-CM | POA: Insufficient documentation

## 2023-09-01 DIAGNOSIS — S0990XA Unspecified injury of head, initial encounter: Secondary | ICD-10-CM | POA: Diagnosis not present

## 2023-09-01 MED ORDER — ACETAMINOPHEN 500 MG PO TABS
1000.0000 mg | ORAL_TABLET | Freq: Once | ORAL | Status: AC
  Administered 2023-09-01: 1000 mg via ORAL
  Filled 2023-09-01: qty 2

## 2023-09-01 NOTE — Discharge Instructions (Signed)
Your CT scan did not show any injury inside the skull.  He had no broken bones in your face and no broken bones in your neck.  Please follow-up with your family doctor in the office.  Typically whenever you fall they will want to see you to review your medications and see if perhaps he would benefit from physical therapy.

## 2023-09-01 NOTE — ED Triage Notes (Signed)
Patient arrives via EMS for a unwitnessed fall. Patient hit her right eye with bruising and swelling noted above the right eye. States pain is 8/10. Also complains of neck pain 8/10. C-Collar in use. No LOC. Patient is verbal in triage. Patient is alert and oriented x3-baseline.

## 2023-09-01 NOTE — ED Notes (Addendum)
Pt in NAD at d/c from ED. A&O. Respirations even & unlabored. Skin warm & dry. Pt verbalized understanding of d/c teaching. Pt discharged with PTAR.Discharged to SNF.

## 2023-09-01 NOTE — ED Notes (Signed)
PTAR CALLED, NO ETA

## 2023-09-01 NOTE — ED Provider Notes (Signed)
[place] EMERGENCY DEPARTMENT AT [facility] Provider Note   CSN: [id number] Arrival date & time: 09/01/23  5102     History  Chief Complaint  Patient presents with   Marie Sandoval is a 77 y.o. female.  77 yo F with a chief complaints of a fall.  Patient says she falls about once a year.  Fell earlier today.  She does not remember exactly what made her fall.  She thinks she lost her balance.  She struck the right side of her face on the ground.  She initially refused transport.  She walked to the bathroom this evening and made it back without falling but decided it was time to come to the ED to be evaluated.   Fall       Home Medications Prior to Admission medications   Medication Sig Start Date End Date Taking? Authorizing Provider  ALPRAZolam Prudy Feeler) 0.25 MG tablet 1 tablet by mouth three times daily as needed x 2 days (use for xanax) 02/05/14   Krell, Claudette T, NP  amLODipine (NORVASC) 5 MG tablet Take 1 tablet by mouth daily. 12/21/20   [provider]  azelastine (OPTIVAR) 0.05 % ophthalmic solution INSTILL 1 DROP INTO EACH EYE TWICE DAILY 08/09/15   [provider]  benzonatate (TESSALON) 100 MG capsule Take 1 capsule by mouth three times daily as needed for cough 11/02/16   [provider]  buPROPion (WELLBUTRIN XL) 150 MG 24 hr tablet Take 1 tablet by mouth every morning. 04/25/20   [provider]  cariprazine (VRAYLAR) capsule Take 1 capsule by mouth every morning. 01/03/21   [provider]  cetirizine (ZYRTEC) 10 MG tablet Take by mouth.    [provider]  fluticasone (FLONASE) 50 MCG/ACT nasal spray 2 sprays by Each Nare route 2 times daily. 01/25/17   [provider]  glipiZIDE (GLUCOTROL) 10 MG tablet Take 1 tablet by mouth daily. 02/07/17   [provider]  hydrOXYzine (ATARAX/VISTARIL) 50 MG tablet Take 1 tablet by mouth daily. 08/11/18   [provider]   levothyroxine (SYNTHROID) 75 MCG tablet TAKE 1 TABLET BY MOUTH ONCE DAILY AT 6 AM 10/10/16   [provider]  lidocaine (LIDODERM) 5 % Place 1 patch onto the skin daily. Remove & Discard patch within 12 hours or as directed by MD 04/21/23   Linwood Dibbles, MD  linagliptin (TRADJENTA) 5 MG TABS tablet Take 1 tablet by mouth daily. 12/21/20   [provider]  lisinopril (ZESTRIL) 5 MG tablet Take 1 tablet by mouth daily. 10/10/16   [provider]  LORazepam (ATIVAN) 0.5 MG tablet 0.5 mg. Take 1 mg in morning.  Take 0.5 mg in evening. 08/15/20   [provider]  metoprolol tartrate (LOPRESSOR) 25 MG tablet Take by mouth. 10/10/16   [provider]  Multiple Vitamin (MULTIVITAMIN) capsule Take 1 capsule by mouth daily with breakfast.    [provider]  nystatin ointment (MYCOSTATIN) APPLY LIBERALLY TOPICALLY TO AFFECTED AREA FOUR TO FIVE TIMES DAILY 11/09/19   [provider]  omeprazole (PRILOSEC) 40 MG capsule Take 1 capsule by mouth daily. 01/09/21   [provider]  Probiotic, Lactobacillus, CAPS Take by mouth.    [provider]  temazepam (RESTORIL) 15 MG capsule Take 1 capsule by mouth at bedtime. 04/26/20   [provider]  traZODone (DESYREL) 50 MG tablet Take 1 tablet by mouth at bedtime. 06/05/20   [provider]  Allergies    Buprenorphine, Gabapentin, Other, Oxycodone-aspirin, and Sulfamethoxazole    Review of Systems   Review of Systems  Physical Exam Updated Vital Signs BP (!) 148/56 (BP Location: Right Arm)   Pulse 65   Temp 98.2 F (36.8 C) (Oral)   Resp 15   SpO2 96%  Physical Exam Vitals and nursing note reviewed.  Constitutional:      General: She is not in acute distress.    Appearance: She is well-developed. She is not diaphoretic.  HENT:     Head: Normocephalic and atraumatic.  Eyes:     Pupils: Pupils are equal, round, and reactive to light.  Cardiovascular:      Rate and Rhythm: Normal rate and regular rhythm.     Heart sounds: No murmur heard.    No friction rub. No gallop.  Pulmonary:     Effort: Pulmonary effort is normal.     Breath sounds: No wheezing or rales.  Abdominal:     General: There is no distension.     Palpations: Abdomen is soft.     Tenderness: There is no abdominal tenderness.  Musculoskeletal:        General: No tenderness.     Cervical back: Normal range of motion and neck supple.  Skin:    General: Skin is warm and dry.  Neurological:     Mental Status: She is alert.     Comments: Some confused verbal response  Psychiatric:        Behavior: Behavior normal.     ED Results / Procedures / Treatments   Labs (all labs ordered are listed, but only abnormal results are displayed) Labs Reviewed - No data to display  EKG EKG Interpretation Date/Time:  Sunday September 01 2023 02:32:49 EST Ventricular Rate:  65 PR Interval:    QRS Duration:  98 QT Interval:  453 QTC Calculation: 471 R Axis:   61  Text Interpretation: Normal sinus rhythm Low voltage, precordial leads Borderline T abnormalities, lateral leads No significant change since last tracing Confirmed by Melene Plan [phone]) on 09/01/2023 3:00:42 AM  Radiology CT Head Wo Contrast  Result Date: 09/01/2023 CLINICAL DATA:  Blunt facial trauma. Unwitnessed fall. Head and neck trauma. EXAM: CT HEAD WITHOUT CONTRAST CT MAXILLOFACIAL WITHOUT CONTRAST CT CERVICAL SPINE WITHOUT CONTRAST TECHNIQUE: Multidetector CT imaging of the head, cervical spine, and maxillofacial structures were performed using the standard protocol without intravenous contrast. Multiplanar CT image reconstructions of the cervical spine and maxillofacial structures were also generated. RADIATION DOSE REDUCTION: This exam was performed according to the departmental dose-optimization program which includes automated exposure control, adjustment of the mA and/or kV according to patient size and/or use of  iterative reconstruction technique. COMPARISON:  None Available. FINDINGS: CT HEAD FINDINGS Brain: No evidence of acute infarction, hemorrhage, hydrocephalus, extra-axial collection or mass lesion/mass effect. Mild cerebral volume loss. Vascular: No hyperdense vessel or unexpected calcification. Skull: Right anterior scalp hematoma without calvarial fracture. CT MAXILLOFACIAL FINDINGS Osseous: No fracture or mandibular dislocation. No destructive process. Orbits: Negative. No traumatic or inflammatory finding. Sinuses: Clear. Soft tissues: Negative. CT CERVICAL SPINE FINDINGS Alignment: Normal. Skull base and vertebrae: No acute fracture. No primary bone lesion or focal pathologic process. Soft tissues and spinal canal: No prevertebral fluid or swelling. No visible canal hematoma. Disc levels:  Generalized degenerative endplate and facet spurring. Upper chest: Negative IMPRESSION: No evidence of acute intracranial or cervical spine injury. Negative for facial fracture. Electronically Signed   By: Tiburcio Pea  M.D.   On: 09/01/2023 03:12   CT Cervical Spine Wo Contrast  Result Date: 09/01/2023 CLINICAL DATA:  Blunt facial trauma. Unwitnessed fall. Head and neck trauma. EXAM: CT HEAD WITHOUT CONTRAST CT MAXILLOFACIAL WITHOUT CONTRAST CT CERVICAL SPINE WITHOUT CONTRAST TECHNIQUE: Multidetector CT imaging of the head, cervical spine, and maxillofacial structures were performed using the standard protocol without intravenous contrast. Multiplanar CT image reconstructions of the cervical spine and maxillofacial structures were also generated. RADIATION DOSE REDUCTION: This exam was performed according to the departmental dose-optimization program which includes automated exposure control, adjustment of the mA and/or kV according to patient size and/or use of iterative reconstruction technique. COMPARISON:  None Available. FINDINGS: CT HEAD FINDINGS Brain: No evidence of acute infarction, hemorrhage, hydrocephalus,  extra-axial collection or mass lesion/mass effect. Mild cerebral volume loss. Vascular: No hyperdense vessel or unexpected calcification. Skull: Right anterior scalp hematoma without calvarial fracture. CT MAXILLOFACIAL FINDINGS Osseous: No fracture or mandibular dislocation. No destructive process. Orbits: Negative. No traumatic or inflammatory finding. Sinuses: Clear. Soft tissues: Negative. CT CERVICAL SPINE FINDINGS Alignment: Normal. Skull base and vertebrae: No acute fracture. No primary bone lesion or focal pathologic process. Soft tissues and spinal canal: No prevertebral fluid or swelling. No visible canal hematoma. Disc levels:  Generalized degenerative endplate and facet spurring. Upper chest: Negative IMPRESSION: No evidence of acute intracranial or cervical spine injury. Negative for facial fracture. Electronically Signed   By: Tiburcio Pea M.D.   On: 09/01/2023 03:12   CT Maxillofacial Wo Contrast  Result Date: 09/01/2023 CLINICAL DATA:  Blunt facial trauma. Unwitnessed fall. Head and neck trauma. EXAM: CT HEAD WITHOUT CONTRAST CT MAXILLOFACIAL WITHOUT CONTRAST CT CERVICAL SPINE WITHOUT CONTRAST TECHNIQUE: Multidetector CT imaging of the head, cervical spine, and maxillofacial structures were performed using the standard protocol without intravenous contrast. Multiplanar CT image reconstructions of the cervical spine and maxillofacial structures were also generated. RADIATION DOSE REDUCTION: This exam was performed according to the departmental dose-optimization program which includes automated exposure control, adjustment of the mA and/or kV according to patient size and/or use of iterative reconstruction technique. COMPARISON:  None Available. FINDINGS: CT HEAD FINDINGS Brain: No evidence of acute infarction, hemorrhage, hydrocephalus, extra-axial collection or mass lesion/mass effect. Mild cerebral volume loss. Vascular: No hyperdense vessel or unexpected calcification. Skull: Right anterior  scalp hematoma without calvarial fracture. CT MAXILLOFACIAL FINDINGS Osseous: No fracture or mandibular dislocation. No destructive process. Orbits: Negative. No traumatic or inflammatory finding. Sinuses: Clear. Soft tissues: Negative. CT CERVICAL SPINE FINDINGS Alignment: Normal. Skull base and vertebrae: No acute fracture. No primary bone lesion or focal pathologic process. Soft tissues and spinal canal: No prevertebral fluid or swelling. No visible canal hematoma. Disc levels:  Generalized degenerative endplate and facet spurring. Upper chest: Negative IMPRESSION: No evidence of acute intracranial or cervical spine injury. Negative for facial fracture. Electronically Signed   By: Tiburcio Pea M.D.   On: 09/01/2023 03:12    Procedures Procedures    Medications Ordered in ED Medications  acetaminophen (TYLENOL) tablet 1,000 mg (has no administration in time range)    ED Course/ Medical Decision Making/ A&P                                 Medical Decision Making Amount and/or Complexity of Data Reviewed Radiology: ordered.  Risk OTC drugs.   77 yo F with a chief complaints of a fall.  Nonsyncopal by history.  Patient complaining of  facial pain.  Will obtain a CT of the head face and C-spine.  Reassess.  CT of the head face and C-spine without acute intercranial pathology.  No acute C-spine fracture.  No facial fracture.  Will discharge home.  PCP follow-up.  3:17 AM:  I have discussed the diagnosis/risks/treatment options with the patient.  Evaluation and diagnostic testing in the emergency department does not suggest an emergent condition requiring admission or immediate intervention beyond what has been performed at this time.  They will follow up with PCP. We also discussed returning to the ED immediately if new or worsening sx occur. We discussed the sx which are most concerning (e.g., sudden worsening pain, fever, inability to tolerate by mouth) that necessitate immediate return.  Medications administered to the patient during their visit and any new prescriptions provided to the patient are listed below.  Medications given during this visit Medications  acetaminophen (TYLENOL) tablet 1,000 mg (has no administration in time range)     The patient appears reasonably screen and/or stabilized for discharge and I doubt any other medical condition or other [facility] requiring further screening, evaluation, or treatment in the ED at this time prior to discharge.          Final Clinical Impression(s) / ED Diagnoses Final diagnoses:  Fall, initial encounter    Rx / DC Orders ED Discharge Orders     None         Melene Plan, DO 09/01/23 1610

## 2023-09-01 NOTE — ED Notes (Signed)
Patient states she was getting out of bed to use the bathroom. Patient normally uses a walker. Patient was not using a walker prior to the fall.

## 2024-01-28 DIAGNOSIS — H25013 Cortical age-related cataract, bilateral: Secondary | ICD-10-CM | POA: Diagnosis present

## 2024-06-02 ENCOUNTER — Other Ambulatory Visit: Payer: Self-pay

## 2024-06-02 ENCOUNTER — Emergency Department (HOSPITAL_COMMUNITY)
Admission: EM | Admit: 2024-06-02 | Discharge: 2024-06-03 | Disposition: A | Discharge status: Home / Self Care | Attending: Emergency Medicine | Admitting: Emergency Medicine

## 2024-06-02 ENCOUNTER — Emergency Department (HOSPITAL_COMMUNITY)

## 2024-06-02 ENCOUNTER — Encounter (HOSPITAL_COMMUNITY): Payer: Self-pay | Admitting: Emergency Medicine

## 2024-06-02 DIAGNOSIS — F259 Schizoaffective disorder, unspecified: Secondary | ICD-10-CM | POA: Diagnosis present

## 2024-06-02 DIAGNOSIS — N1832 Chronic kidney disease, stage 3b: Secondary | ICD-10-CM | POA: Diagnosis present

## 2024-06-02 DIAGNOSIS — S3992XA Unspecified injury of lower back, initial encounter: Secondary | ICD-10-CM | POA: Diagnosis present

## 2024-06-02 DIAGNOSIS — I10 Essential (primary) hypertension: Secondary | ICD-10-CM | POA: Diagnosis present

## 2024-06-02 DIAGNOSIS — Y92129 Unspecified place in nursing home as the place of occurrence of the external cause: Secondary | ICD-10-CM | POA: Diagnosis not present

## 2024-06-02 DIAGNOSIS — W19XXXA Unspecified fall, initial encounter: Secondary | ICD-10-CM | POA: Insufficient documentation

## 2024-06-02 DIAGNOSIS — F0390 Unspecified dementia without behavioral disturbance: Secondary | ICD-10-CM | POA: Diagnosis present

## 2024-06-02 DIAGNOSIS — G2581 Restless legs syndrome: Secondary | ICD-10-CM | POA: Diagnosis present

## 2024-06-02 DIAGNOSIS — K219 Gastro-esophageal reflux disease without esophagitis: Secondary | ICD-10-CM | POA: Diagnosis present

## 2024-06-02 DIAGNOSIS — J45909 Unspecified asthma, uncomplicated: Secondary | ICD-10-CM | POA: Diagnosis present

## 2024-06-02 DIAGNOSIS — F419 Anxiety disorder, unspecified: Secondary | ICD-10-CM | POA: Diagnosis present

## 2024-06-02 DIAGNOSIS — E1342 Other specified diabetes mellitus with diabetic polyneuropathy: Secondary | ICD-10-CM | POA: Diagnosis present

## 2024-06-02 DIAGNOSIS — H25013 Cortical age-related cataract, bilateral: Secondary | ICD-10-CM | POA: Diagnosis present

## 2024-06-02 DIAGNOSIS — G8929 Other chronic pain: Secondary | ICD-10-CM | POA: Diagnosis present

## 2024-06-02 DIAGNOSIS — S0101XA Laceration without foreign body of scalp, initial encounter: Secondary | ICD-10-CM | POA: Insufficient documentation

## 2024-06-02 DIAGNOSIS — J449 Chronic obstructive pulmonary disease, unspecified: Secondary | ICD-10-CM | POA: Diagnosis present

## 2024-06-02 DIAGNOSIS — S32040A Wedge compression fracture of fourth lumbar vertebra, initial encounter for closed fracture: Secondary | ICD-10-CM | POA: Diagnosis not present

## 2024-06-02 DIAGNOSIS — E119 Type 2 diabetes mellitus without complications: Secondary | ICD-10-CM

## 2024-06-02 DIAGNOSIS — G43909 Migraine, unspecified, not intractable, without status migrainosus: Secondary | ICD-10-CM | POA: Diagnosis present

## 2024-06-02 DIAGNOSIS — M797 Fibromyalgia: Secondary | ICD-10-CM | POA: Diagnosis present

## 2024-06-02 DIAGNOSIS — E039 Hypothyroidism, unspecified: Secondary | ICD-10-CM | POA: Diagnosis present

## 2024-06-02 HISTORY — DX: Unspecified dementia, unspecified severity, without behavioral disturbance, psychotic disturbance, mood disturbance, and anxiety: F03.90

## 2024-06-02 LAB — CBC WITH DIFFERENTIAL/PLATELET
Abs Immature Granulocytes: 0.12 K/uL — ABNORMAL HIGH (ref 0.00–0.07)
Basophils Absolute: 0.1 K/uL (ref 0.0–0.1)
Basophils Relative: 2 %
Eosinophils Absolute: 0.1 K/uL (ref 0.0–0.5)
Eosinophils Relative: 2 %
HCT: 44.2 % (ref 36.0–46.0)
Hemoglobin: 14.3 g/dL (ref 12.0–15.0)
Immature Granulocytes: 3 %
Lymphocytes Relative: 20 %
Lymphs Abs: 1 K/uL (ref 0.7–4.0)
MCH: 33 pg (ref 26.0–34.0)
MCHC: 32.4 g/dL (ref 30.0–36.0)
MCV: 102.1 fL — ABNORMAL HIGH (ref 80.0–100.0)
Monocytes Absolute: 0.5 K/uL (ref 0.1–1.0)
Monocytes Relative: 10 %
Neutro Abs: 3.1 K/uL (ref 1.7–7.7)
Neutrophils Relative %: 63 %
Platelets: 131 K/uL — ABNORMAL LOW (ref 150–400)
RBC: 4.33 MIL/uL (ref 3.87–5.11)
RDW: 13.6 % (ref 11.5–15.5)
WBC: 4.8 K/uL (ref 4.0–10.5)
nRBC: 0 % (ref 0.0–0.2)

## 2024-06-02 LAB — BASIC METABOLIC PANEL WITH GFR
Anion gap: 9 (ref 5–15)
BUN: 19 mg/dL (ref 8–23)
CO2: 21 mmol/L — ABNORMAL LOW (ref 22–32)
Calcium: 9.2 mg/dL (ref 8.9–10.3)
Chloride: 111 mmol/L (ref 98–111)
Creatinine, Ser: 1.33 mg/dL — ABNORMAL HIGH (ref 0.44–1.00)
GFR, Estimated: 41 mL/min — ABNORMAL LOW (ref >60)
Glucose, Bld: 111 mg/dL — ABNORMAL HIGH (ref 70–99)
Potassium: 4.1 mmol/L (ref 3.5–5.1)
Sodium: 141 mmol/L (ref 135–145)

## 2024-06-02 LAB — I-STAT CHEM 8, ED
BUN: 21 mg/dL (ref 8–23)
Calcium, Ion: 1.11 mmol/L — ABNORMAL LOW (ref 1.15–1.40)
Chloride: 110 mmol/L (ref 98–111)
Creatinine, Ser: 1.4 mg/dL — ABNORMAL HIGH (ref 0.44–1.00)
Glucose, Bld: 111 mg/dL — ABNORMAL HIGH (ref 70–99)
HCT: 43 % (ref 36.0–46.0)
Hemoglobin: 14.6 g/dL (ref 12.0–15.0)
Potassium: 4 mmol/L (ref 3.5–5.1)
Sodium: 142 mmol/L (ref 135–145)
TCO2: 22 mmol/L (ref 22–32)

## 2024-06-02 MED ORDER — CLONAZEPAM 0.5 MG PO TABS
1.0000 mg | ORAL_TABLET | Freq: Three times a day (TID) | ORAL | Status: DC | PRN
  Administered 2024-06-02 – 2024-06-03 (×2): 1 mg via ORAL
  Filled 2024-06-02 (×2): qty 2

## 2024-06-02 MED ORDER — BUPROPION HCL ER (XL) 150 MG PO TB24
150.0000 mg | ORAL_TABLET | Freq: Every morning | ORAL | Status: DC
  Administered 2024-06-02 – 2024-06-03 (×2): 150 mg via ORAL
  Filled 2024-06-02 (×2): qty 1

## 2024-06-02 MED ORDER — PANTOPRAZOLE SODIUM 40 MG PO TBEC
40.0000 mg | DELAYED_RELEASE_TABLET | Freq: Every day | ORAL | Status: DC
  Administered 2024-06-02 – 2024-06-03 (×2): 40 mg via ORAL
  Filled 2024-06-02 (×2): qty 1

## 2024-06-02 MED ORDER — LEVOTHYROXINE SODIUM 75 MCG PO TABS
75.0000 ug | ORAL_TABLET | Freq: Every day | ORAL | Status: DC
  Administered 2024-06-03: 75 ug via ORAL
  Filled 2024-06-02: qty 1

## 2024-06-02 MED ORDER — TEMAZEPAM 15 MG PO CAPS
15.0000 mg | ORAL_CAPSULE | Freq: Every day | ORAL | Status: DC
  Administered 2024-06-02: 15 mg via ORAL
  Filled 2024-06-02: qty 1

## 2024-06-02 MED ORDER — QUETIAPINE FUMARATE 100 MG PO TABS
100.0000 mg | ORAL_TABLET | Freq: Two times a day (BID) | ORAL | Status: DC
  Administered 2024-06-02 – 2024-06-03 (×2): 100 mg via ORAL
  Filled 2024-06-02 (×2): qty 4

## 2024-06-02 MED ORDER — AMLODIPINE BESYLATE 5 MG PO TABS
5.0000 mg | ORAL_TABLET | Freq: Every day | ORAL | Status: DC
  Administered 2024-06-02 – 2024-06-03 (×2): 5 mg via ORAL
  Filled 2024-06-02 (×2): qty 1

## 2024-06-02 MED ORDER — LISINOPRIL 10 MG PO TABS
5.0000 mg | ORAL_TABLET | Freq: Every day | ORAL | Status: DC
  Administered 2024-06-02 – 2024-06-03 (×2): 5 mg via ORAL
  Filled 2024-06-02 (×2): qty 1

## 2024-06-02 MED ORDER — DONEPEZIL HCL 10 MG PO TABS
10.0000 mg | ORAL_TABLET | Freq: Every day | ORAL | Status: DC
  Administered 2024-06-02: 10 mg via ORAL
  Filled 2024-06-02: qty 2

## 2024-06-02 MED ORDER — CLONAZEPAM 0.5 MG PO TABS
1.0000 mg | ORAL_TABLET | Freq: Once | ORAL | Status: AC
  Administered 2024-06-02: 1 mg via ORAL
  Filled 2024-06-02: qty 2

## 2024-06-02 MED ORDER — LINAGLIPTIN 5 MG PO TABS
5.0000 mg | ORAL_TABLET | Freq: Every day | ORAL | Status: DC
  Administered 2024-06-02 – 2024-06-03 (×2): 5 mg via ORAL
  Filled 2024-06-02 (×2): qty 1

## 2024-06-02 MED ORDER — CLONAZEPAM 0.5 MG PO TABS: 0.5000 mg | ORAL_TABLET | Freq: Once | ORAL | Status: DC

## 2024-06-02 MED ORDER — TRAZODONE HCL 50 MG PO TABS
50.0000 mg | ORAL_TABLET | Freq: Every day | ORAL | Status: DC
  Administered 2024-06-02: 50 mg via ORAL
  Filled 2024-06-02: qty 1

## 2024-06-02 MED ORDER — HYDROCODONE-ACETAMINOPHEN 5-325 MG PO TABS
1.0000 | ORAL_TABLET | Freq: Once | ORAL | Status: AC
  Administered 2024-06-02: 1 via ORAL
  Filled 2024-06-02: qty 1

## 2024-06-02 MED ORDER — LIDOCAINE-EPINEPHRINE-TETRACAINE (LET) TOPICAL GEL
3.0000 mL | Freq: Once | TOPICAL | Status: AC
  Administered 2024-06-02: 3 mL via TOPICAL
  Filled 2024-06-02: qty 3

## 2024-06-02 MED ORDER — GLIPIZIDE 10 MG PO TABS
10.0000 mg | ORAL_TABLET | Freq: Every day | ORAL | Status: DC
  Administered 2024-06-02 – 2024-06-03 (×2): 10 mg via ORAL
  Filled 2024-06-02 (×3): qty 1

## 2024-06-02 MED ORDER — LIDOCAINE 5 % EX PTCH
1.0000 | MEDICATED_PATCH | CUTANEOUS | Status: DC
  Administered 2024-06-02: 1 via TRANSDERMAL
  Filled 2024-06-02: qty 1

## 2024-06-02 NOTE — NC FL2 (Signed)
 [place]  MEDICAID FL2 LEVEL OF CARE FORM     IDENTIFICATION  Patient Name: Marie Sandoval Birthdate: [birth year] Sex: female Admission Date (Current Location): 06/02/2024  [facility] and [place] Number:  Producer, television/film/video and Address:  [facility], [address], [place], [place] [address]      Provider Number: [id number]  Attending Physician Name and Address:  Lenor Hollering, MD  Relative Name and Phone Number:       Current Level of Care: Hospital Recommended Level of Care: Skilled Nursing Facility Prior Approval Number:    Date Approved/Denied:   PASRR Number: [id number]  Discharge Plan: SNF    Current Diagnoses: Patient Active Problem List   Diagnosis Date Noted   Cortical age-related cataract of both eyes 01/28/2024   Dementia, neurological (HCC) 08/16/2022   COPD (chronic obstructive pulmonary disease) (HCC) 03/13/2021   Diabetes mellitus (HCC) 03/13/2021   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   Anxiety disorder 03/11/2021   Chronic bilateral back pain 08/18/2018   Polyneuropathy due to secondary diabetes mellitus (HCC) 11/26/2017   Asthma 03/08/2014   Acquired hypothyroidism 03/08/2014   Fibromyalgia 03/08/2014   Gastroesophageal reflux disease without esophagitis 03/08/2014   Migraine 03/08/2014   Restless legs syndrome (RLS) 03/08/2014   Stage 3b chronic kidney disease (HCC) 03/08/2014   Hypomagnesemia 02/20/2014   Hypokalemia 02/17/2014   HTN (hypertension) 02/17/2014   Sternal fracture 02/13/2014   Schizoaffective disorder (HCC) 02/13/2014   Liver function test abnormality 02/13/2014    Orientation RESPIRATION BLADDER Height & Weight     Self  Normal Continent Weight: 150 lb (68 kg) Height:  5' 6 (167.6 cm)  BEHAVIORAL SYMPTOMS/MOOD NEUROLOGICAL BOWEL NUTRITION STATUS      Continent Diet  AMBULATORY STATUS COMMUNICATION OF NEEDS Skin   Extensive Assist Verbally Normal                       Personal Care  Assistance Level of Assistance  Bathing, Feeding, Dressing Bathing Assistance: Limited assistance Feeding assistance: Limited assistance Dressing Assistance: Limited assistance     Functional Limitations Info  Sight, Hearing, Speech Sight Info: Adequate Hearing Info: Adequate Speech Info: Adequate    SPECIAL CARE FACTORS FREQUENCY  PT (By licensed PT), OT (By licensed OT)     PT Frequency: 5x weekly OT Frequency: 5x weekly            Contractures Contractures Info: Not present    Additional Factors Info  Allergies, Code Status Code Status Info: Full Code Allergies Info: Buprenorphine  Gabapentin  Other  Oxycodone-aspirin  Sulfamethoxazole           Current Medications (06/02/2024):  This is the current hospital active medication list No current facility-administered medications for this encounter.   Current Outpatient Medications  Medication Sig Dispense Refill   ALPRAZolam  (XANAX ) 0.25 MG tablet 1 tablet by mouth three times daily as needed x 2 days (use for xanax ) 6 tablet 0   amLODipine  (NORVASC ) 5 MG tablet Take 1 tablet by mouth daily.     azelastine (OPTIVAR) 0.05 % ophthalmic solution INSTILL 1 DROP INTO EACH EYE TWICE DAILY     benzonatate (TESSALON) 100 MG capsule Take 1 capsule by mouth three times daily as needed for cough     buPROPion  (WELLBUTRIN  XL) 150 MG 24 hr tablet Take 1 tablet by mouth every morning.     cariprazine (VRAYLAR) capsule Take 1 capsule by mouth every morning.     cetirizine (ZYRTEC)  10 MG tablet Take by mouth.     fluticasone (FLONASE) 50 MCG/ACT nasal spray 2 sprays by Each Nare route 2 times daily.     glipiZIDE  (GLUCOTROL ) 10 MG tablet Take 1 tablet by mouth daily.     hydrOXYzine (ATARAX/VISTARIL) 50 MG tablet Take 1 tablet by mouth daily.     levothyroxine  (SYNTHROID ) 75 MCG tablet TAKE 1 TABLET BY MOUTH ONCE DAILY AT 6 AM     lidocaine  (LIDODERM ) 5 % Place 1 patch onto the skin daily. Remove & Discard patch within 12 hours or as  directed by MD 30 patch 0   linagliptin  (TRADJENTA ) 5 MG TABS tablet Take 1 tablet by mouth daily.     lisinopril  (ZESTRIL ) 5 MG tablet Take 1 tablet by mouth daily.     LORazepam (ATIVAN) 0.5 MG tablet 0.5 mg. Take 1 mg in morning.  Take 0.5 mg in evening.     metoprolol tartrate (LOPRESSOR) 25 MG tablet Take by mouth.     Multiple Vitamin (MULTIVITAMIN) capsule Take 1 capsule by mouth daily with breakfast.     nystatin ointment (MYCOSTATIN) APPLY LIBERALLY TOPICALLY TO AFFECTED AREA FOUR TO FIVE TIMES DAILY     omeprazole (PRILOSEC) 40 MG capsule Take 1 capsule by mouth daily.     Probiotic, Lactobacillus, CAPS Take by mouth.     temazepam  (RESTORIL ) 15 MG capsule Take 1 capsule by mouth at bedtime.     traZODone  (DESYREL ) 50 MG tablet Take 1 tablet by mouth at bedtime.       Discharge Medications: Please see discharge summary for a list of discharge medications.  Relevant Imaging Results:  Relevant Lab Results:   Additional Information SSN: [id number]  Niels LITTIE Portugal, LCSW

## 2024-06-02 NOTE — Evaluation (Signed)
 Physical Therapy Evaluation Patient Details Name: Marie Sandoval MRN: [id number] DOB: 15-Nov-1945 Today's Date: 06/02/2024  History of Present Illness  78 y.o. female presents to [facility] hospital on 06/02/2024 after a fall. Imaging notable for new L4 compression fx. PMH includes dementia, HTN, schizoaffective disorder.  Clinical Impression  Pt presents to PT with deficits in functional mobility, gait, balance, power, endurance. Pt has a history of dementia and demonstrates poor utilization of DME, requiring frequent cues for hand placement during transfers and often abandoning walker when turning to sit at desired destination. Pt is at a high risk for falls currently and will benefit from short term inpatient PT services to improve consistency of DME use along with balance. Patient will benefit from continued inpatient follow up therapy, <3 hours/day.        If plan is discharge home, recommend the following: A little help with walking and/or transfers;A little help with bathing/dressing/bathroom;Assistance with cooking/housework;Direct supervision/assist for medications management;Direct supervision/assist for financial management;Assist for transportation;Help with stairs or ramp for entrance;Supervision due to cognitive status   Can travel by private vehicle   Yes    Equipment Recommendations  (defer to post-acute setting)  Recommendations for Other Services       Functional Status Assessment Patient has had a recent decline in their functional status and demonstrates the ability to make significant improvements in function in a reasonable and predictable amount of time.     Precautions / Restrictions Precautions Precautions: Fall Recall of Precautions/Restrictions: Intact Restrictions Weight Bearing Restrictions Per Provider Order: No      Mobility  Bed Mobility Overal bed mobility: Needs Assistance Bed Mobility: Supine to Sit, Sit to Supine     Supine to sit: Min assist Sit to  supine: Contact guard assist        Transfers Overall transfer level: Needs assistance Equipment used: Rolling walker (2 wheels), 1 person hand held assist Transfers: Sit to/from Stand, Bed to chair/wheelchair/BSC Sit to Stand: Min assist   Step pivot transfers: Min assist       General transfer comment: verbal cues for hand placement and sequencing of transfer, pt attempting to sit prior to reaching stretcher and requires cues and physical assistance to correct    Ambulation/Gait Ambulation/Gait assistance: Contact guard assist Gait Distance (Feet): 20 Feet Assistive device: Rolling walker (2 wheels) Gait Pattern/deviations: Step-to pattern Gait velocity: reduced Gait velocity interpretation: <1.31 ft/sec, indicative of household ambulator   General Gait Details: slowed step-to gait  Stairs            Wheelchair Mobility     Tilt Bed    Modified Rankin (Stroke Patients Only)       Balance Overall balance assessment: Needs assistance Sitting-balance support: No upper extremity supported, Feet supported Sitting balance-Leahy Scale: Fair     Standing balance support: Single extremity supported, Reliant on assistive device for balance Standing balance-Leahy Scale: Poor                               Pertinent Vitals/Pain Pain Assessment Pain Assessment: Faces Faces Pain Scale: Hurts little more Pain Location: R hip Pain Descriptors / Indicators: Sore Pain Intervention(s): Monitored during session    Home Living Family/patient expects to be discharged to:: Assisted living                 Home Equipment: Agricultural consultant (2 wheels)      Prior Function Prior Level of Function :  History of Falls (last six months);Needs assist             Mobility Comments: ambulates with use of RW, daughter reports the patient has difficulty recalling to utilize her walker and often utilizes it inappropriately       Extremity/Trunk Assessment    Upper Extremity Assessment Upper Extremity Assessment: Generalized weakness    Lower Extremity Assessment Lower Extremity Assessment: Generalized weakness    Cervical / Trunk Assessment Cervical / Trunk Assessment: Kyphotic  Communication   Communication Communication: No apparent difficulties    Cognition Arousal: Alert Behavior During Therapy: Anxious   PT - Cognitive impairments: History of cognitive impairments                       PT - Cognition Comments: pt with dementia at baseline Following commands: Impaired Following commands impaired: Follows one step commands with increased time, Follows multi-step commands inconsistently     Cueing Cueing Techniques: Verbal cues     General Comments General comments (skin integrity, edema, etc.): VSS on RA    Exercises     Assessment/Plan    PT Assessment Patient needs continued PT services  PT Problem List Decreased strength;Decreased activity tolerance;Decreased balance;Decreased mobility;Decreased knowledge of use of DME;Decreased safety awareness;Decreased knowledge of precautions;Pain       PT Treatment Interventions DME instruction;Gait training;Stair training;Functional mobility training;Therapeutic activities;Therapeutic exercise;Balance training;Neuromuscular re-education;Patient/family education    PT Goals (Current goals can be found in the Care Plan section)  Acute Rehab PT Goals Patient Stated Goal: to reduce frequency of falls PT Goal Formulation: With patient/family Time For Goal Achievement: 06/16/24 Potential to Achieve Goals: Fair    Frequency Min 2X/week     Co-evaluation               AM-PAC PT 6 Clicks Mobility  Outcome Measure Help needed turning from your back to your side while in a flat bed without using bedrails?: A Little Help needed moving from lying on your back to sitting on the side of a flat bed without using bedrails?: A Little Help needed moving to and from  a bed to a chair (including a wheelchair)?: A Little Help needed standing up from a chair using your arms (e.g., wheelchair or bedside chair)?: A Little Help needed to walk in hospital room?: A Little Help needed climbing 3-5 steps with a railing? : Total 6 Click Score: 16    End of Session Equipment Utilized During Treatment: Gait belt Activity Tolerance: Patient tolerated treatment well Patient left: in bed;with call bell/phone within reach;with family/visitor present Nurse Communication: Mobility status PT Visit Diagnosis: Other abnormalities of gait and mobility (R26.89);Muscle weakness (generalized) (M62.81);History of falling (Z91.81)    Time: 8542-8468 PT Time Calculation (min) (ACUTE ONLY): 34 min   Charges:   PT Evaluation $PT Eval Low Complexity: 1 Low   PT General Charges $$ ACUTE PT VISIT: 1 Visit         Bernardino JINNY Ruth, PT, DPT Acute Rehabilitation Office [phone]   Bernardino JINNY Ruth 06/02/2024, 3:40 PM

## 2024-06-02 NOTE — ED Triage Notes (Signed)
 Per [facility] pt coming from [facility] for unwitnessed fall this morning. Laceration to back of head on left side. C/o neck and back pain. Per facility patient at her baseline. Hx of dementia. Unsure of any LOC. No thinners on paperwork.
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
## 2024-06-02 NOTE — Progress Notes (Addendum)
 3:15pm: CSW spoke with Levon at [facility] who states the facility can offer patient a bed.  CSW spoke with patient's daughter Asberry who is agreeable to accept bed offer from [facility]. Asberry states PT is at patient's bedside working with her right now.  CSW spoke with CMA to request insurance authorization be initiated once PT note is available.  2:30pm: CSW received consult for patient for possible SNF placement.  CSW spoke with patient's daughter Asberry to discuss possible SNF placement. Asberry states she is agreeable for CSW to initiate SNF workup. Asberry states patient is from the ALF side of [facility] on [address]. Asberry states patient has had frequent falls at the facility and is in need of short term rehab. Asberry states patient was at [facility] for [employer] about 8 years ago. Asberry agreeable to return to [facility] if a bed offer can be obtained.  CSW sent secure chat to MD to request a PT consult be placed.  CSW will complete FL2 and fax patient's clinicals out to obtain bed offers.  Niels Portugal, MSW, LCSW Transitions of Care  Clinical Social Worker II [phone]

## 2024-06-02 NOTE — ED Provider Notes (Addendum)
 [facility] Provider Note   CSN: [id number] Arrival date & time: 06/02/24  1013     Patient presents with: Marie Sandoval is a 79 y.o. female.   Patient 78 year old who presents after a fall.  She lives at [facility].  She has a history of dementia so history is limited but had an unwitnessed fall.  She indicates she was going to the bathroom but unclear if this is accurate information.  She has a wound to the back of her head.  She complains of pain in her back.  It does not appear that she is on anticoagulants from her medication list.  On chart review, she has had a history of frequent falls with a fall in May where she sustained a T11 compression fracture.  She has since followed up with orthopedic surgery and it did not require any operative intervention.  She has also had falls in June and July of this year that required ED visits.       Prior to Admission medications   Medication Sig Start Date End Date Taking? Authorizing Provider  ALPRAZolam  (XANAX ) 0.25 MG tablet 1 tablet by mouth three times daily as needed x 2 days (use for xanax ) 02/05/14   Krell, Claudette T, NP  amLODipine  (NORVASC ) 5 MG tablet Take 1 tablet by mouth daily. 12/21/20   [provider]  azelastine (OPTIVAR) 0.05 % ophthalmic solution INSTILL 1 DROP INTO EACH EYE TWICE DAILY 08/09/15   [provider]  benzonatate (TESSALON) 100 MG capsule Take 1 capsule by mouth three times daily as needed for cough 11/02/16   [provider]  buPROPion  (WELLBUTRIN  XL) 150 MG 24 hr tablet Take 1 tablet by mouth every morning. 04/25/20   [provider]  cariprazine (VRAYLAR) capsule Take 1 capsule by mouth every morning. 01/03/21   [provider]  cetirizine (ZYRTEC) 10 MG tablet Take by mouth.    [provider]  fluticasone (FLONASE) 50 MCG/ACT nasal spray 2 sprays by Each Nare route 2 times daily. 01/25/17    [provider]  glipiZIDE  (GLUCOTROL ) 10 MG tablet Take 1 tablet by mouth daily. 02/07/17   [provider]  hydrOXYzine (ATARAX/VISTARIL) 50 MG tablet Take 1 tablet by mouth daily. 08/11/18   [provider]  levothyroxine  (SYNTHROID ) 75 MCG tablet TAKE 1 TABLET BY MOUTH ONCE DAILY AT 6 AM 10/10/16   [provider]  lidocaine  (LIDODERM ) 5 % Place 1 patch onto the skin daily. Remove & Discard patch within 12 hours or as directed by MD 04/21/23   Randol Simmonds, MD  linagliptin  (TRADJENTA ) 5 MG TABS tablet Take 1 tablet by mouth daily. 12/21/20   [provider]  lisinopril  (ZESTRIL ) 5 MG tablet Take 1 tablet by mouth daily. 10/10/16   [provider]  LORazepam (ATIVAN) 0.5 MG tablet 0.5 mg. Take 1 mg in morning.  Take 0.5 mg in evening. 08/15/20   [provider]  metoprolol tartrate (LOPRESSOR) 25 MG tablet Take by mouth. 10/10/16   [provider]  Multiple Vitamin (MULTIVITAMIN) capsule Take 1 capsule by mouth daily with breakfast.    [provider]  nystatin ointment (MYCOSTATIN) APPLY LIBERALLY TOPICALLY TO AFFECTED AREA FOUR TO FIVE TIMES DAILY 11/09/19   [provider]  omeprazole (PRILOSEC) 40 MG capsule Take 1 capsule by mouth daily. 01/09/21   [provider]  Probiotic, Lactobacillus, CAPS Take by mouth.  [provider]  temazepam  (RESTORIL ) 15 MG capsule Take 1 capsule by mouth at bedtime. 04/26/20   [provider]  traZODone  (DESYREL ) 50 MG tablet Take 1 tablet by mouth at bedtime. 06/05/20   [provider]    Allergies: Buprenorphine, Gabapentin, Other, Oxycodone-aspirin, and Sulfamethoxazole    Review of Systems  Unable to perform ROS: Dementia    Updated Vital Signs BP (!) 146/58   Pulse 71   Temp 98.7 F (37.1 C) (Axillary)   Resp 15   Ht 5' 6 (1.676 m)   Wt 68 kg   SpO2 100%   BMI 24.21 kg/m   Physical Exam Constitutional:      Appearance:  She is well-developed.  HENT:     Head: Normocephalic.     Comments: Small amount of dried blood to the posterior scalp Eyes:     Pupils: Pupils are equal, round, and reactive to light.  Neck:     Comments: C-collar in place, she has some generalized tenderness to her cervical, thoracic and lumbosacral spine with some tenderness in the left upper back along the rib cage as well, no crepitus or deformity Cardiovascular:     Rate and Rhythm: Normal rate and regular rhythm.     Heart sounds: Normal heart sounds.  Pulmonary:     Effort: Pulmonary effort is normal. No respiratory distress.     Breath sounds: Normal breath sounds. No wheezing or rales.  Chest:     Chest wall: Tenderness (Tenderness along the left ribs, no visible external trauma is noted) present.  Abdominal:     General: Bowel sounds are normal.     Palpations: Abdomen is soft.     Tenderness: There is no abdominal tenderness. There is no guarding or rebound.  Musculoskeletal:        General: Normal range of motion.     Comments: No pain on palpation or range of motion of the extremities, including the hips  Lymphadenopathy:     Cervical: No cervical adenopathy.  Skin:    General: Skin is warm and dry.     Findings: No rash.  Neurological:     General: No focal deficit present.     Mental Status: She is alert.     Comments: Oriented to person and place, moves all extremities symmetrically without obvious focal deficits     (all labs ordered are listed, but only abnormal results are displayed) Labs Reviewed  BASIC METABOLIC PANEL WITH GFR - Abnormal; Notable for the following components:      Result Value   CO2 21 (*)    Glucose, Bld 111 (*)    Creatinine, Ser 1.33 (*)    GFR, Estimated 41 (*)    All other components within normal limits  CBC WITH DIFFERENTIAL/PLATELET - Abnormal; Notable for the following components:   MCV 102.1 (*)    Platelets 131 (*)    Abs Immature Granulocytes 0.12 (*)    All other  components within normal limits  I-STAT CHEM 8, ED - Abnormal; Notable for the following components:   Creatinine, Ser 1.40 (*)    Glucose, Bld 111 (*)    Calcium, Ion 1.11 (*)    All other components within normal limits    EKG: EKG Interpretation Date/Time:  Tuesday June 02 2024 10:19:59 EDT Ventricular Rate:  69 PR Interval:  191 QRS Duration:  89 QT Interval:  422 QTC Calculation: 453 R Axis:   45  Text Interpretation: Sinus rhythm  Confirmed by Lenor Hollering [phone]) on 06/02/2024 10:32:24 AM  Radiology: [facility] Ribs Unilateral W/Chest Left Result Date: 06/02/2024 CLINICAL DATA:  Fall. EXAM: LEFT RIBS AND CHEST - 3+ VIEW COMPARISON:  Chest radiograph dated 04/21/2023. FINDINGS: Overlapping telemetry wires. No obvious displaced rib fracture. Redemonstrated mild chronic T11 compression deformity. There is no evidence of pneumothorax or pleural effusion. Both lungs are clear. Heart size and mediastinal contours are within normal limits. IMPRESSION: 1.  No obvious displaced rib fracture. 2. Redemonstrated chronic mild T11 compression deformity. Electronically Signed   By: Harrietta Sherry M.D.   On: 06/02/2024 12:33   CT Head Wo Contrast Result Date: 06/02/2024 EXAM: CT HEAD WITHOUT CONTRAST 06/02/2024 11:02:25 AM TECHNIQUE: CT of the head was performed without the administration of intravenous contrast. Automated exposure control, iterative reconstruction, and/or weight based adjustment of the mA/kV was utilized to reduce the radiation dose to as low as reasonably achievable. COMPARISON: CT of the head dated 09/01/2023. CLINICAL HISTORY: Head trauma, minor (Age >= 65y). FINDINGS: BRAIN AND VENTRICLES: No acute hemorrhage. Gray-white differentiation is preserved. No hydrocephalus. No extra-axial collection. No mass effect or midline shift. Age-related cerebral volume loss. Mild-to-moderate periventricular white matter disease. ORBITS: No acute abnormality. SINUSES: No acute abnormality. SOFT  TISSUES AND SKULL: No acute soft tissue abnormality. No skull fracture. VASCULATURE: Calcifications within the carotid siphons bilaterally. IMPRESSION: 1. No acute intracranial abnormality. 2. Age-related cerebral volume loss and mild-to-moderate periventricular white matter disease. Electronically signed by: evalene coho 06/02/2024 11:28 AM EDT RP Workstation: [id number]   CT Thoracic Spine Wo Contrast Result Date: 06/02/2024 EXAM: CT THORACIC SPINE WITHOUT CONTRAST 06/02/2024 11:02:25 AM TECHNIQUE: CT of the thoracic spine was performed without the administration of intravenous contrast. Multiplanar reformatted images are provided for review. Automated exposure control, iterative reconstruction, and/or weight based adjustment of the mA/kV was utilized to reduce the radiation dose to as low as reasonably achievable. COMPARISON: Thoracic spine series dated 03/21/2024. CLINICAL HISTORY: Back trauma, no prior imaging (Age >= 16y). FINDINGS: BONES AND ALIGNMENT: Mild levoscoliosis of the thoracolumbar spine. Mild chronic compression deformity of T11, which was present on the previous study. The other thoracic vertebrae maintain their height. DEGENERATIVE CHANGES: Mild disc space narrowing present at multiple levels, but no apparent disc herniation or significant spinal canal or neural foraminal stenosis throughout the thoracic spine. SOFT TISSUES: No paraspinous hematoma evident. LUNGS: Ground-glass and reticular opacities present dependently within the lower lobes bilaterally, likely representing mild edema and atelectasis. IMPRESSION: 1. No acute abnormality of the thoracic spine related to the back trauma. 2. Mild chronic compression deformity of T11, present on the previous study. 3. Mild disc space narrowing at multiple levels without significant spinal canal or neural foraminal stenosis. Electronically signed by: evalene coho 06/02/2024 11:27 AM EDT RP Workstation: [id number]   CT Lumbar Spine Wo  Contrast Result Date: 06/02/2024 EXAM: CT OF THE LUMBAR SPINE WITHOUT CONTRAST 06/02/2024 11:02:25 AM TECHNIQUE: CT of the lumbar spine was performed without the administration of intravenous contrast. Multiplanar reformatted images are provided for review. Automated exposure control, iterative reconstruction, and/or weight based adjustment of the mA/kV was utilized to reduce the radiation dose to as low as reasonably achievable. COMPARISON: None available. CLINICAL HISTORY: Back trauma, no prior imaging. FINDINGS: BONES AND ALIGNMENT: There is a mild sigmoid curvature of the thoracolumbar spine with the upper curvature convex to the right and the lower convex to the left. There is a likely acute or recent compression fracture of L4 with mild retropulsion of the  posterior superior corner of this vertebral body by approximately 6 mm. The vertebral body has lost approximately 33% of its height centrally. There is moderate central spinal canal stenosis from the retropulsed fracture. There is a chronic-appearing compression deformity of T11 also demonstrated. DEGENERATIVE CHANGES: There is moderate chronic degenerative disc disease at L2-3, with disc space narrowing and posterior endplate ridging causing mild central spinal canal stenosis and mild bilateral neural foraminal stenosis. L3-4 demonstrates vacuum phenomenon and diffuse disc bulging with moderate central spinal canal stenosis and moderate bilateral lateral recess stenosis. At L4-5 there is a broad based disc bulge, which is eccentric to the left. There is bilateral facet hypertrophy, resulting in moderate central spinal canal stenosis and moderate left lateral recess stenosis. There is no definite nerve root impingement. L5-S1 shows disc space narrowing and mild diffuse endplate ridging with mild central spinal canal stenosis and bilateral neural foraminal stenosis. SOFT TISSUES: There are a few chronic diverticula present. IMPRESSION: 1. Likely acute or  recent compression fracture of L4 with mild retropulsion of the posterior superior corner by approximately 6 mm, resulting in moderate central spinal canal stenosis. The vertebral body has lost approximately 33% of its height centrally. 2. Chronic-appearing compression deformity of T11. 3. Moderate chronic degenerative disc disease at L2-3 with mild central spinal canal stenosis and mild bilateral neural foraminal stenosis. 4. Diffuse disc bulging at L3-4 with moderate central spinal canal stenosis and moderate bilateral lateral recess stenosis. 5. Broad-based disc bulge at L4-5, eccentric to the left, with moderate central spinal canal stenosis and moderate left lateral recess stenosis. No definite nerve root impingement. 6. Disc space narrowing and mild diffuse endplate ridging at L5-S1 with mild central spinal canal stenosis and bilateral neural foraminal stenosis. Electronically signed by: evalene coho 06/02/2024 11:20 AM EDT RP Workstation: [id number]   CT Cervical Spine Wo Contrast Result Date: 06/02/2024 EXAM: CT CERVICAL SPINE WITHOUT CONTRAST 06/02/2024 11:02:25 AM TECHNIQUE: CT of the cervical spine was performed without the administration of intravenous contrast. Multiplanar reformatted images are provided for review. Automated exposure control, iterative reconstruction, and/or weight based adjustment of the mA/kV was utilized to reduce the radiation dose to as low as reasonably achievable. COMPARISON: CT of the cervical spine dated 05/09/2024. CLINICAL HISTORY: Neck trauma (Age >= 65y). CT Head Wo Contrast; Head trauma, minor (Age >= 65y); CT Cervical Spine Wo Contrast; Neck trauma (Age >= 65y); CT Lumbar Spine Wo Contrast; Back trauma, no prior imaging (Age >= 16y); CT Thoracic Spine Wo Contrast; Back trauma, no prior imaging (Age >= 16y) FINDINGS: CERVICAL SPINE: BONES AND ALIGNMENT: There is slight anterolisthesis at C3-4 and mild anterolisthesis at C4-5. DEGENERATIVE CHANGES: C3-4:  Mild-to-moderate central spinal canal stenosis and moderate left neural foraminal stenosis. C4-5: Moderate central spinal canal stenosis and moderate-to-severe right neural foraminal stenosis. C5-6: Posterior endplate ridging causing moderate central spinal canal stenosis and moderate bilateral neural foraminal stenosis. SOFT TISSUES: There are calcified pleural plaques present posterolaterally in the apex of the right chest cavity. IMPRESSION: 1. No acute abnormality of the cervical spine related to the reported neck trauma. 2. Slight anterolisthesis at C3-4 and mild anterolisthesis at C4-5. 3. Mild-to-moderate central spinal canal stenosis and moderate left neural foraminal stenosis at C3-4. 4. Moderate central spinal canal stenosis and moderate-to-severe right neural foraminal stenosis at C4-5. 5. Moderate central spinal canal stenosis and moderate bilateral neural foraminal stenosis at C5-6 due to posterior endplate ridging. Electronically signed by: evalene coho 06/02/2024 11:13 AM EDT RP Workstation: [id number]     .  Laceration Repair  Date/Time: 06/02/2024 6:25 PM  Performed by: Lenor Hollering, MD Authorized by: Lenor Hollering, MD   Consent:    Consent obtained:  Verbal   Consent given by: daughter.   Risks, benefits, and alternatives were discussed: yes   Universal protocol:    Patient identity confirmed:  Verbally with patient Anesthesia:    Anesthesia method:  Topical application   Topical anesthetic:  LET Laceration details:    Location:  Scalp   Length (cm):  1 Pre-procedure details:    Preparation:  Imaging obtained to evaluate for foreign bodies and patient was prepped and draped in usual sterile fashion Exploration:    Hemostasis achieved with:  LET   Imaging outcome: foreign body not noted     Wound exploration: entire depth of wound visualized     Wound extent: fascia not violated, no foreign body, no signs of injury, no underlying fracture and no vascular damage      Contaminated: no   Treatment:    Area cleansed with:  Saline   Amount of cleaning:  Standard   Irrigation solution:  Sterile saline   Irrigation method:  Syringe   Visualized foreign bodies/material removed: no   Skin repair:    Repair method:  Staples   Number of staples:  2 Approximation:    Approximation:  Close Repair type:    Repair type:  Simple Post-procedure details:    Dressing:  Open (no dressing)   Procedure completion:  Tolerated well, no immediate complications    Medications Ordered in the ED  amLODipine  (NORVASC ) tablet 5 mg (has no administration in time range)  lisinopril  (ZESTRIL ) tablet 5 mg (has no administration in time range)  buPROPion  (WELLBUTRIN  XL) 24 hr tablet 150 mg (has no administration in time range)  temazepam  (RESTORIL ) capsule 15 mg (has no administration in time range)  traZODone  (DESYREL ) tablet 50 mg (has no administration in time range)  glipiZIDE  (GLUCOTROL ) tablet 10 mg (has no administration in time range)  linagliptin  (TRADJENTA ) tablet 5 mg (has no administration in time range)  pantoprazole  (PROTONIX ) EC tablet 40 mg (has no administration in time range)  levothyroxine  (SYNTHROID ) tablet 75 mcg (has no administration in time range)  clonazePAM  (KLONOPIN ) tablet 1 mg (has no administration in time range)  donepezil  (ARICEPT ) tablet 10 mg (has no administration in time range)  clonazePAM  (KLONOPIN ) tablet 1 mg (has no administration in time range)  lidocaine  (LIDODERM ) 5 % 1 patch (has no administration in time range)  HYDROcodone -acetaminophen  (NORCO/VICODIN) 5-325 MG per tablet 1 tablet (1 tablet Oral Given 06/02/24 1308)  lidocaine -EPINEPHrine -tetracaine  (LET) topical gel (3 mLs Topical Given 06/02/24 1335)                                    Medical Decision Making Amount and/or Complexity of Data Reviewed Labs: ordered. Radiology: ordered.  Risk Prescription drug management.   Patient is a 78 year old who presents after an  unwitnessed fall.  Labs reviewed and are nonconcerning.  She had CT of her head and cervical spine which do not show any acute abnormality.  CT of the thoracic spine reveals an old appearing T11 fracture which is consistent with review of her records.  CT of her lumbosacral spine shows a new appearing L4 compression fracture with some retropulsion of fragments.  Discussed with neurosurgeon, Dr. Darnella, who has evaluated the patient.  He did recommend a lumbar brace  but patient declines.  Daughter states that patient has not tolerated this in the past.  She does express concern that the assisted living facility is not equipped to continue to care for this patient as she has had frequent falls and they feel that she needs a higher level of care.  I did discuss with Dr. Melvin to see if patient would qualify for hospital admission.  He does not feel that patient would likely qualify for inpatient treatment.  Discussed with TOC who will see if they can get patient placed to a higher level of care facility.     Final diagnoses:  Fall, initial encounter  Closed wedge compression fracture of L4 vertebra, initial encounter [facility])    ED Discharge Orders     None          Lenor Hollering, MD 06/02/24 1623    Lenor Hollering, MD 06/02/24 1827

## 2024-06-02 NOTE — Discharge Instructions (Addendum)
 You will need to have the staples removed in one week from your head.

## 2024-06-03 NOTE — ED Provider Notes (Signed)
 Emergency Medicine Observation Re-evaluation Note  Marie Sandoval is a 78 y.o. female, seen on rounds today.  Pt initially presented to the ED for complaints of Fall Currently, the patient is resting comfortably.  Physical Exam  BP (!) 129/58   Pulse 61   Temp 98.1 F (36.7 C) (Oral)   Resp 14   Ht 5' 6 (1.676 m)   Wt 68 kg   SpO2 100%   BMI 24.21 kg/m  Physical Exam General: No acute distress, resting comfortably Cardiac: Regular rate and rhythm Lungs: No respiratory distress  ED Course / MDM  EKG:EKG Interpretation Date/Time:  Tuesday June 02 2024 10:19:59 EDT Ventricular Rate:  69 PR Interval:  191 QRS Duration:  89 QT Interval:  422 QTC Calculation: 453 R Axis:   45  Text Interpretation: Sinus rhythm Confirmed by Lenor Hollering [phone]) on 06/02/2024 10:32:24 AM  I have reviewed the labs performed to date as well as medications administered while in observation.  Recent changes in the last 24 hours include the patient has been accepted at a rehab facility.  Plan  Current plan is for discharge to rehab facility later this morning.  The patient did request that I call her daughter to discuss this.  I did call and talk to her daughter who is aware and is heading to the rehab facility now.    Ula Prentice SAUNDERS, MD 06/03/24 [phone]

## 2024-06-03 NOTE — Progress Notes (Addendum)
 9:55am: Per Levon at [facility], [place] to complete admission paperwork around noon today. Nikki requesting patient be discharged at that time.  Patient will go to [facility] via [facility] - CSW scheduled pick up for 12pm. The number to call for report is [phone].  CSW informed RN and MD of discharge plan.  9:25am: Patient's insurance authorization has been approved, [employer]) [phone], next review date is 06/10/24.  CSW notified Levon at [facility] of approval. Levon to contact daughter [place] to arrange a time for admissions paperwork to be completed.  8am: Patient's insurance authorization is pending at this time.  Niels Portugal, MSW, LCSW Transitions of Care  Clinical Social Worker II [phone]

## 2024-06-03 NOTE — ED Notes (Signed)
 [facility] called and transfer report given to [facility]. All questions answered. No reported concerns for patient placement

## 2024-06-03 NOTE — ED Notes (Signed)
 [facility] called [facility] within an hour

## 2024-08-29 ENCOUNTER — Emergency Department (HOSPITAL_COMMUNITY)

## 2024-08-29 ENCOUNTER — Other Ambulatory Visit: Payer: Self-pay

## 2024-08-29 ENCOUNTER — Emergency Department (HOSPITAL_COMMUNITY)
Admission: EM | Admit: 2024-08-29 | Discharge: 2024-08-29 | Disposition: A | Discharge status: Skilled Nursing Facility | Attending: Emergency Medicine | Admitting: Emergency Medicine

## 2024-08-29 DIAGNOSIS — F0390 Unspecified dementia without behavioral disturbance: Secondary | ICD-10-CM | POA: Insufficient documentation

## 2024-08-29 DIAGNOSIS — I10 Essential (primary) hypertension: Secondary | ICD-10-CM | POA: Insufficient documentation

## 2024-08-29 DIAGNOSIS — Z79899 Other long term (current) drug therapy: Secondary | ICD-10-CM | POA: Insufficient documentation

## 2024-08-29 DIAGNOSIS — E119 Type 2 diabetes mellitus without complications: Secondary | ICD-10-CM | POA: Insufficient documentation

## 2024-08-29 DIAGNOSIS — S0101XA Laceration without foreign body of scalp, initial encounter: Secondary | ICD-10-CM | POA: Insufficient documentation

## 2024-08-29 DIAGNOSIS — Z7984 Long term (current) use of oral hypoglycemic drugs: Secondary | ICD-10-CM | POA: Insufficient documentation

## 2024-08-29 DIAGNOSIS — W1830XA Fall on same level, unspecified, initial encounter: Secondary | ICD-10-CM | POA: Insufficient documentation

## 2024-08-29 DIAGNOSIS — W19XXXA Unspecified fall, initial encounter: Secondary | ICD-10-CM

## 2024-08-29 LAB — URINALYSIS, ROUTINE W REFLEX MICROSCOPIC
Bilirubin Urine: NEGATIVE
Glucose, UA: >=500 mg/dL — AB
Ketones, ur: NEGATIVE mg/dL
Leukocytes,Ua: NEGATIVE
Nitrite: NEGATIVE
Protein, ur: NEGATIVE mg/dL
Specific Gravity, Urine: 1.01 (ref 1.005–1.030)
pH: 5 (ref 5.0–8.0)

## 2024-08-29 LAB — CBC
HCT: 38.7 % (ref 36.0–46.0)
Hemoglobin: 13.1 g/dL (ref 12.0–15.0)
MCH: 34.1 pg — ABNORMAL HIGH (ref 26.0–34.0)
MCHC: 33.9 g/dL (ref 30.0–36.0)
MCV: 100.8 fL — ABNORMAL HIGH (ref 80.0–100.0)
Platelets: 120 K/uL — ABNORMAL LOW (ref 150–400)
RBC: 3.84 MIL/uL — ABNORMAL LOW (ref 3.87–5.11)
RDW: 13.9 % (ref 11.5–15.5)
WBC: 7.6 K/uL (ref 4.0–10.5)
nRBC: 0 % (ref 0.0–0.2)

## 2024-08-29 LAB — BASIC METABOLIC PANEL WITH GFR
Anion gap: 14 (ref 5–15)
BUN: 18 mg/dL (ref 8–23)
CO2: 18 mmol/L — ABNORMAL LOW (ref 22–32)
Calcium: 8.7 mg/dL — ABNORMAL LOW (ref 8.9–10.3)
Chloride: 106 mmol/L (ref 98–111)
Creatinine, Ser: 1.4 mg/dL — ABNORMAL HIGH (ref 0.44–1.00)
GFR, Estimated: 39 mL/min — ABNORMAL LOW (ref >60)
Glucose, Bld: 166 mg/dL — ABNORMAL HIGH (ref 70–99)
Potassium: 4.1 mmol/L (ref 3.5–5.1)
Sodium: 138 mmol/L (ref 135–145)

## 2024-08-29 LAB — PROTIME-INR
INR: 1.1 (ref 0.8–1.2)
Prothrombin Time: 14.6 s (ref 11.4–15.2)

## 2024-08-29 MED ORDER — TETANUS-DIPHTH-ACELL PERTUSSIS 5-2-15.5 LF-MCG/0.5 IM SUSP
0.5000 mL | Freq: Once | INTRAMUSCULAR | Status: AC
  Administered 2024-08-29: 0.5 mL via INTRAMUSCULAR
  Filled 2024-08-29: qty 0.5

## 2024-08-29 MED ORDER — SODIUM CHLORIDE 0.9 % IV BOLUS
500.0000 mL | Freq: Once | INTRAVENOUS | Status: AC
  Administered 2024-08-29: 500 mL via INTRAVENOUS

## 2024-08-29 MED ORDER — ACETAMINOPHEN 500 MG PO TABS
1000.0000 mg | ORAL_TABLET | Freq: Once | ORAL | Status: AC
Start: 2024-08-29 — End: 2024-08-29
  Administered 2024-08-29: 1000 mg via ORAL
  Filled 2024-08-29: qty 2

## 2024-08-29 NOTE — ED Notes (Signed)
 PA attempted to call pt daughter x2. No answer

## 2024-08-29 NOTE — ED Provider Notes (Signed)
 [place] [facility] AT [facility] Provider Note   CSN: [id number] Arrival date & time: 08/29/24  9857     Patient presents with: Marie Sandoval is a 78 y.o. female.   78 year old female with a history of hypertension, diabetes, dementia presents to the emergency department from [facility] for evaluation after an unwitnessed fall.  She was found lying on the ground covered in blood.  Patient complains of pain all over. EMS applied C-collar prior to transport.  Facility [facility] without documentation of chronic anticoagulant use.  Staff reported to EMS that patient is altered at baseline and did not appear to have any acute change from what is known for her.   The history is provided by the EMS personnel and the patient. No language interpreter was used.  Fall       Prior to Admission medications   Medication Sig Start Date End Date Taking? Authorizing Provider  acetaminophen  (TYLENOL ) 500 MG tablet Take 1,000 mg by mouth in the morning, at noon, and at bedtime.    [provider]  albuterol (VENTOLIN HFA) 108 (90 Base) MCG/ACT inhaler Inhale 2 puffs into the lungs every 6 (six) hours as needed for wheezing.    [provider]  amLODipine  (NORVASC ) 5 MG tablet Take 1 tablet by mouth daily. 12/21/20   [provider]  azelastine (OPTIVAR) 0.05 % ophthalmic solution INSTILL 1 DROP INTO EACH EYE TWICE DAILY 08/09/15   [provider]  buPROPion  (WELLBUTRIN  XL) 300 MG 24 hr tablet Take 300 mg by mouth every morning. 04/25/20   [provider]  clonazePAM  (KLONOPIN ) 1 MG tablet Take 1 mg by mouth in the morning, at noon, and at bedtime.    [provider]  diclofenac Sodium (VOLTAREN ARTHRITIS PAIN) 1 % GEL Apply 2 g topically in the morning, at noon, and at bedtime. Apply to right shoulder for pain    [provider]  diclofenac Sodium (VOLTAREN) 1 % GEL Apply 2 g topically in the morning, at noon, and at  bedtime. Apply to right knee pain    [provider]  fluticasone (FLONASE) 50 MCG/ACT nasal spray 2 sprays by Each Nare route 2 times daily. 01/25/17   [provider]  glipiZIDE  (GLUCOTROL ) 10 MG tablet Take 1 tablet by mouth daily. Patient not taking: Reported on 06/03/2024 02/07/17   [provider]  JARDIANCE 25 MG TABS tablet Take 25 mg by mouth daily.    [provider]  levothyroxine  (SYNTHROID ) 75 MCG tablet TAKE 1 TABLET BY MOUTH ONCE DAILY AT 6 AM 10/10/16   [provider]  linagliptin  (TRADJENTA ) 5 MG TABS tablet Take 1 tablet by mouth daily. 12/21/20   [provider]  lisinopril  (ZESTRIL ) 5 MG tablet Take 1 tablet by mouth daily. Patient not taking: Reported on 06/03/2024 10/10/16   [provider]  loperamide (IMODIUM) 2 MG capsule Take 2 mg by mouth as needed for diarrhea or loose stools.    [provider]  melatonin 5 MG TABS Take 5 mg by mouth at bedtime.    [provider]  montelukast (SINGULAIR) 10 MG tablet Take 10 mg by mouth at bedtime.    [provider]  omeprazole (PRILOSEC) 40 MG capsule Take 1 capsule by mouth in the morning and at bedtime. 01/09/21   [provider]  polyethylene glycol (MIRALAX / GLYCOLAX) 17 g packet Take 17 g by mouth daily as needed for mild constipation.  [provider]  Probiotic, Lactobacillus, CAPS Take 1 capsule by mouth daily.    [provider]  QUEtiapine  (SEROQUEL ) 100 MG tablet Take 100 mg by mouth 2 (two) times daily.    [provider]  sertraline (ZOLOFT) 50 MG tablet Take 50 mg by mouth at bedtime.    [provider]  temazepam  (RESTORIL ) 15 MG capsule Take 1 capsule by mouth at bedtime. Patient not taking: Reported on 06/03/2024 04/26/20   [provider]  traMADol (ULTRAM) 50 MG tablet Take 50 mg by mouth 2 (two) times daily. May be given 1 tablet every 12 hours as needed for pain. Do not within  6 hours of scheduled dose    [provider]  traZODone  (DESYREL ) 100 MG tablet Take 100 mg by mouth at bedtime. 06/05/20   [provider]    Allergies: Buprenorphine, Gabapentin, Oxycodone-aspirin, Sulfamethoxazole, Oxycodone, and Sulfa antibiotics    Review of Systems Ten systems reviewed and are negative for acute change, except as noted in the HPI.    Updated Vital Signs BP (!) 138/47   Pulse 89   Temp (!) 97.2 F (36.2 C) (Oral)   Resp 13   SpO2 100%   Physical Exam Vitals and nursing note reviewed.  Constitutional:      General: She is not in acute distress.    Appearance: She is well-developed. She is not diaphoretic.     Comments: Elderly frail female.  HENT:     Head: Normocephalic.     Comments: Laceration to right temporal scalp. Bleeding controlled with pressure.    Right Ear: External ear normal.     Left Ear: External ear normal.  Eyes:     General: No scleral icterus.    Extraocular Movements: Extraocular movements intact.     Conjunctiva/sclera: Conjunctivae normal.     Pupils: Pupils are equal, round, and reactive to light.  Neck:     Comments: C-collar in place Cardiovascular:     Rate and Rhythm: Normal rate and regular rhythm.     Pulses: Normal pulses.  Pulmonary:     Effort: Pulmonary effort is normal. No respiratory distress.     Comments: Respirations even and unlabored Musculoskeletal:        General: Normal range of motion.     Comments: No leg shortening or malrotation.  Skin:    General: Skin is warm and dry.     Coloration: Skin is not pale.     Findings: No erythema or rash.  Neurological:     Mental Status: She is alert and oriented to person, place, and time.     Coordination: Coordination normal.  Psychiatric:        Behavior: Behavior normal.     (all labs ordered are listed, but only abnormal results are displayed) Labs Reviewed  CBC - Abnormal; Notable for the following components:      Result Value    RBC 3.84 (*)    MCV 100.8 (*)    MCH 34.1 (*)    Platelets 120 (*)    All other components within normal limits  URINALYSIS, ROUTINE W REFLEX MICROSCOPIC - Abnormal; Notable for the following components:   Glucose, UA >=500 (*)    Hgb urine dipstick LARGE (*)    Bacteria, UA FEW (*)    All other components within normal limits  BASIC METABOLIC PANEL WITH GFR - Abnormal; Notable for the following components:   CO2 18 (*)    Glucose,  Bld 166 (*)    Creatinine, Ser 1.40 (*)    Calcium 8.7 (*)    GFR, Estimated 39 (*)    All other components within normal limits  PROTIME-INR    EKG: EKG Interpretation Date/Time:  Saturday August 29 2024 05:42:56 EDT Ventricular Rate:  85 PR Interval:  163 QRS Duration:  85 QT Interval:  388 QTC Calculation: 462 R Axis:   52  Text Interpretation: Sinus rhythm Low voltage, precordial leads No significant change was found Confirmed by Carita Senior [phone]) on 08/29/2024 5:51:17 AM  Radiology: [facility] Chest Port 1 View Result Date: 08/29/2024 CLINICAL DATA:  Unwitnessed fall. EXAM: PORTABLE CHEST 1 VIEW COMPARISON:  June 02, 2024 FINDINGS: The heart size and mediastinal contours are within normal limits. Low lung volumes are noted without evidence of acute infiltrate, pleural effusion or pneumothorax. Chronic sixth and seventh right rib fractures are seen. Multilevel degenerative changes are present throughout the thoracic spine. IMPRESSION: Low lung volumes without evidence of acute or active cardiopulmonary disease. Electronically Signed   By: Suzen Dials M.D.   On: 08/29/2024 02:52   CT HEAD WO CONTRAST ( ) Result Date: 08/29/2024 CLINICAL DATA:  Unwitnessed fall. EXAM: CT HEAD WITHOUT CONTRAST TECHNIQUE: Contiguous axial images were obtained from the base of the skull through the vertex without intravenous contrast. RADIATION DOSE REDUCTION: This exam was performed according to the departmental dose-optimization program which includes  automated exposure control, adjustment of the mA and/or kV according to patient size and/or use of iterative reconstruction technique. COMPARISON:  June 02, 2024 FINDINGS: Brain: There is generalized cerebral atrophy with widening of the extra-axial spaces and ventricular dilatation. There are areas of decreased attenuation within the white matter tracts of the supratentorial brain, consistent with microvascular disease changes. Vascular: No hyperdense vessel or unexpected calcification. Skull: Normal. Negative for fracture or focal lesion. Sinuses/Orbits: No acute finding. Other: There is mild to moderate severity right frontal scalp soft tissue swelling with an associated scalp soft tissue defect. IMPRESSION: 1. Generalized cerebral atrophy and microvascular disease changes of the supratentorial brain. 2. No acute intracranial abnormality. 3. Mild to moderate severity right frontal scalp soft tissue swelling with an associated scalp soft tissue defect. Electronically Signed   By: Suzen Dials M.D.   On: 08/29/2024 02:50   CT Cervical Spine Wo Contrast Result Date: 08/29/2024 CLINICAL DATA:  Unwitnessed fall. EXAM: CT CERVICAL SPINE WITHOUT CONTRAST TECHNIQUE: Multidetector CT imaging of the cervical spine was performed without intravenous contrast. Multiplanar CT image reconstructions were also generated. RADIATION DOSE REDUCTION: This exam was performed according to the departmental dose-optimization program which includes automated exposure control, adjustment of the mA and/or kV according to patient size and/or use of iterative reconstruction technique. COMPARISON:  June 02, 2024 FINDINGS: Alignment: Normal. Skull base and vertebrae: No acute fracture. Chronic and degenerative changes are seen involving the body and tip of the dens, as well as the adjacent portion of the anterior arch of C1. Soft tissues and spinal canal: No prevertebral fluid or swelling. No visible canal hematoma. Disc levels: There  is marked severity multilevel endplate sclerosis throughout the cervical spine with moderate to marked severity anterior osteophyte formation and posterior bony spurring also seen at the levels of C3-C4, C4-C5, C5-C6 and C6-C7. There is marked severity narrowing of the anterior atlantoaxial articulation with marked severity intervertebral disc space narrowing present at C5-C6. Mild to moderate severity intervertebral disc space narrowing is seen throughout the remainder of the cervical spine. Bilateral marked severity multilevel  facet joint hypertrophy is noted. Upper chest: Negative. Other: None. IMPRESSION: 1. No acute fracture or subluxation in the cervical spine. 2. Marked severity multilevel degenerative changes, as described above. Electronically Signed   By: Suzen Dials M.D.   On: 08/29/2024 02:48   DG Pelvis Portable Result Date: 08/29/2024 CLINICAL DATA:  Unwitnessed fall. EXAM: PORTABLE PELVIS 1-2 VIEWS COMPARISON:  Mar 21, 2024 FINDINGS: There is no evidence of pelvic fracture or diastasis. No pelvic bone lesions are seen. IMPRESSION: Negative. Electronically Signed   By: Suzen Dials M.D.   On: 08/29/2024 02:44     Procedures   LACERATION REPAIR Performed by: Burnard Light Authorized by: Burnard Light Consent: Verbal consent obtained. Risks and benefits: risks, benefits and alternatives were discussed Consent given by: patient Patient identity confirmed: provided demographic data Prepped and Draped in normal sterile fashion Wound explored  Laceration Location: scalp  Laceration Length: 4cm  No Foreign Bodies seen or palpated  Amount of cleaning: standard  Skin closure: staples  Number of staples: 4  Technique: simple  Patient tolerance: Patient tolerated the procedure well with no immediate complications.   Medications Ordered in the ED  acetaminophen  (TYLENOL ) tablet 1,000 mg (has no administration in time range)  Tdap (ADACEL) injection 0.5 mL (0.5 mLs  Intramuscular Given 08/29/24 0408)  sodium chloride 0.9 % bolus 500 mL (500 mLs Intravenous New Bag/Given 08/29/24 0546)    Clinical Course as of 08/29/24 0600  Sat Aug 29, 2024  0207 Called daughter, emergency contact. No answer. HIPAA compliant voicemail left. [KH]  [phone] Second attempt at contact with daughter; call went to voicemail. [KH]  0422 No palpable, pulsatile bleeding at site of scalp laceration. No active bleeding on reassessment. Laceration closed with staples x4. [KH]  [phone] Third attempt to contact daughter prior to patient's transport back to her facility. Phone continues to go to voicemail. [KH]  [phone] RN to contact [facility] for transport. [KH]    Clinical Course User Index [KH] Light Burnard, PA-C                                 Medical Decision Making Amount and/or Complexity of Data Reviewed Labs: ordered. Radiology: ordered. ECG/medicine tests: ordered.  Risk OTC drugs. Prescription drug management.   This patient presents to the ED for concern of fall, this involves an extensive number of treatment options, and is a complaint that carries with it a high risk of complications and morbidity.  The differential diagnosis includes syncope vs arrhythmia vs mechanical fall vs seizure.   Co morbidities that complicate the patient evaluation  HTN DM Dementia   Additional history obtained:  Additional history obtained from EMS personnel   Lab Tests:  I Ordered, and personally interpreted labs.  The pertinent results include:  CO2 18, Creatinine 1.4.   Imaging Studies ordered:  I ordered imaging studies including CT head and C-spine, CXR, pelvis Xray  I independently visualized and interpreted imaging which showed no acute/traumatic pathology I agree with the radiologist interpretation   Cardiac Monitoring:  The patient was maintained on a cardiac monitor.  I personally viewed and interpreted the cardiac monitored which showed an underlying rhythm of:  NSR   Medicines ordered and prescription drug management:  I ordered medication including tylenol  for pain  I have reviewed the patients home medicines and have made adjustments as needed   Test Considered:  Repeat BMP   Problem List /  ED Course:  As above Unwitnessed fall at facility.  Wound to right side of scalp closed with staples without complication.  CT head and cervical spine negative for acute, traumatic pathology.  No skull fracture, intracranial hemorrhage, traumatic hydrocephalus.  Screening x-ray of chest and pelvis also negative for fracture, dislocation, or other acute process. Patient hemodynamically stable and resting comfortably in the emergency department.  Creatinine is 1.4 which is consistent with her historical baseline.  Labs, otherwise, stable. Plan for transfer back to SNF via [facility].   Reevaluation:  After the interventions noted above, I reevaluated the patient and found that they have :stayed the same   Social Determinants of Health:  Assistance during ambulation   Dispostion:  After consideration of the diagnostic results and the patients response to treatment, I feel that the patent would benefit from tylenol  PRN for pain. Advised f/u with PCP in 1 week for removal of staples. Return precautions provided. Patient discharged in stable condition via [facility] with no unaddressed concerns.       Final diagnoses:  Laceration of scalp, initial encounter  Fall, initial encounter    ED Discharge Orders     None          Keith Sor, PA-C 08/29/24 9394    Carita Senior, MD 08/29/24 [phone]

## 2024-08-29 NOTE — Discharge Instructions (Addendum)
 Take Tylenol  as needed for pain/headache. Have staples removed in 1 week. Return to the ED for new or concerning symptoms.

## 2024-08-29 NOTE — ED Triage Notes (Addendum)
 Pt bib [facility] from [facility] after having an unwitnessed fall. Pt found laying on the floor covered in blood. Per EMS the ground, her clothing and the towel used on laceration were covered in blood from a laceration on forehead/ right side of head. Pt is altered at baseline and arrives AOx3. Complains of a headache, neck pain and right arm pain. CCollar in place. Denies thinners and no thinners found in documentation.

## 2024-09-24 NOTE — ED Provider Notes (Signed)
 [facility] Emergency Department Provider Note  History and Review of Systems  Marie Sandoval is a 78 y.o. year-old female with a medical history discussed below presenting to the ED with chief complaint of fall.  Patient was attempting to get up to use the bathroom with assistance from her daughter, and reports that her daughter turned around and she fell to the ground.  No loss of consciousness or syncope.  Reports that she did strike her head and she is hurting everywhere.   Additional history obtained from: Family Member   Review of Systems A pertinent review of systems was obtained and was negative except as noted in the HPI and MDM.  Past Medical History:  Buprenorphine, Gabapentin, Oxycodone hcl-oxycodone-asa, Oxycodone-aspirin, Sulfamethoxazole, and Sulfa (sulfonamide antibiotics)  Medical History[1]  Surgical History[2] Family History[3]  Social History[4]  Home Medications: Current Outpatient Medications  Medication Instructions   acetaminophen  (TYLENOL ) 500 mg, Every 6 hours PRN   albuterol HFA (PROVENTIL HFA;VENTOLIN HFA;PROAIR HFA) 90 mcg/actuation inhaler 2 puffs, inhalation, Every 6 hours PRN   amLODIPine  (NORVASC ) 5 mg, oral, Daily   biotin 10 mg tab tablet 1 tablet, Daily   buPROPion  (WELLBUTRIN  XL) 300 mg, Every morning   clonazePAM  (KLONOPIN ) 1 mg, Daily   diclofenac sodium (VOLTAREN) 2 g, 2 times daily PRN   donepeziL  (ARICEPT ) 10 mg, oral, Nightly   fluticasone propionate (FLONASE) 50 mcg/spray nasal spray 2 sprays, Each Nostril, 2 times daily   glipiZIDE  (GLUCOTROL ) 10 mg, oral, Daily   hydrOXYzine (ATARAX) 50 mg, oral, Daily   Jardiance 25 mg, Every morning   Lactobacillus acidophilus (Probiotic) 10 billion cell cap 1 capsule, Every morning   levocetirizine (XYZAL) 5 mg, Every evening   levothyroxine  (SYNTHROID ) 75 mcg tablet TAKE 1 TABLET BY MOUTH ONCE DAILY AT 6 AM   linaGLIPtin  (TRADJENTA ) 5 mg, Daily   loperamide (IMODIUM) 2 mg,  4 times daily PRN   LORazepam (ATIVAN) 1 mg, oral, Every 12 hours PRN   melatonin 5 mg, Nightly   metoprolol tartrate (LOPRESSOR) 25 mg, oral, 2 times daily   montelukast (SINGULAIR) 10 mg tablet Take 1 tablet by mouth nightly   multivitamin cap 1 capsule, Daily with breakfast   naproxen (NAPROSYN) 250 mg, oral, 2 times daily with meals   nystatin (MYCOSTATIN) 100,000 unit/gram ointment APPLY LIBERALLY TOPICALLY TO AFFECTED AREA FOUR TO FIVE TIMES DAILY   omeprazole (PRILOSEC) 40 mg, oral, 2 times daily   polyethylene glycol (GLYCOLAX) 17 g, Daily   QUEtiapine  (SEROQUEL ) 100 mg, Every morning   QUEtiapine  (SEROQUEL ) 50 mg, Nightly   sertraline (ZOLOFT) 25 mg, Daily   Tradjenta  5 mg, oral, Daily   traMADoL (ULTRAM) 50 mg tablet TAKE 2 TABLETS BY MOUTH TWICE DAILY AS NEEDED FOR MODERATE PAIN  (4-6)   traZODone  (DESYREL ) 100 mg, Nightly     Physical Exam  Gen: A&Ox4, NAD.  HEENT: Normocephalic and atraumatic, EOMI, not icteric. Moist mucous membranes.  Small 1 cm laceration to the posterior occiput Neck: Supple, full range of motion, no observable masses.  Lungs: No Respiratory distress. Lungs CTA BL. CV: RRR, no audible heart murmur. No peripheral edema. Abdomen: Soft, nontender, nondistended.  MSK: No obvious deformities, no joint swelling.  Skin: No rashes, petechiae, no notable lesions. Normal color per patient.  Neuro: Oriented at baseline. Moves all 4 extremities equally. No focal deficits.  Psych: Appropriate for situation.   Medical Decision Making  Visit Vitals BP 128/80  Pulse 79  Temp 97.6 F (36.4 C) (  Oral)  Resp 13  SpO2 100%    Assessment/Impression/DDx: Briefly, Marie Sandoval is a 78 y.o. female with a past medical history as noted above who presents with fall. Initial vitals as above. This is a 78year old who sustained a ground level fall. Given age, will obtain CT head/cervical spine. Blood thinner status: negative. Physical exam reveals pain  diffusely in the chest wall and abdomen with no obvious deformities or ecchymosis, pain in the bilateral knees with decreased active range of motion but no obvious deformities and neurovascularly intact distally, diffuse pain throughout her lumbar and thoracic spine with no step-offs or deformities. Vitals stable. Currently at neurologic baseline. Will continue to reassess frequently.   Patient reassessment and disposition details: ED Course as of 09/24/24 1853  Thu Sep 24, 2024  1235 CT Head WO Contrast W Quant CT Tiss Character When Performed IMPRESSION:   No acute intracranial abnormality.       Chronic age-related vascular changes  [SG]  1235 CT Spine Cervical WO Contrast IMPRESSION:   No acute cervical spine fracture or dislocation.  [SG]  1434 CT Spine Thoracic Lumbar Reformats W Contrast IMPRESSION:   Acute compression fracture of L4 with 60% vertebral body height loss and fragment displacement at the superiorendplate causing moderate to severe spinal canal stenosis.  Mild compression fracture at the superior endplate of T11 is likely acute. There is 10% vertebral body height loss and there is no fragment retropulsion. Age indeterminate mild compression fracture at the superior endplate T7 with less than 10% vertebral body height loss and without fragment retropulsion.  Age indeterminate mild compression fracture at the superior endplate of T4 with 10% vertebral body height loss and without fragment retropulsion.   Electronically signed by: Glori Good, MD on 09/24/2024 02:15:10 PM [facility] Per PQRS, all CT exams are performed using one or more of the following dose reduction techniques: automated exposure control, adjustment of the mA and/or kV according to patient size, or use of iterative reconstruction technique.  [SG]  1436 Spoke with [place].   Facility [facility].  [SG]  1446 Spoke with Dr. Darcey   Clamshell TLSO brace and follow up with Vernell Bohr, NP  in 1-2 weeks as outpatient  [SG]  1446 Called [facility], reached after hours business clinic.  [SG]  1611 Extensive discussion with the patient and the daughter over the phone.  Daughter is the patient's healthcare power of attorney [place] who lives nearby but unable to come and evaluate the patient in the ED.  Reports that she is at her baseline. patient would like to go home, family is okay with this.  Patient does not wish to have the TLSO brace, would like to be discharged without it.  Will make sure that she has Ortho follow-up at discharge, discussed this with the healthcare power of attorney.  Patient was offered multiple ways to fix her laceration to the occiput, has declined sutures, staples, glue, I discussed that this might be a small wound but would benefit from repair the patient adamantly refuses, gave strict return precautions and instructions to keep the area clean.  Vital signs are stable.  Patient is currently at baseline.  Workup has been generally reassuring.  No evidence of infection.  Patient discharged home, although again she was offered admission and declined [SG]    ED Course User Index [SG] Sharon Joann Guarino, DO    Procedures   Procedures  ED MEDS: Medications  fentaNYL  (SUBLIMAZE ) injection 25 mcg (25 mcg  intravenous Given 09/24/24 1227)  iohexoL (OMNIPAQUE) 350 mg iodine/mL injection (MDV) 80 mL (80 mL intravenous Given 09/24/24 1344)  acetaminophen  (TYLENOL ) tablet 650 mg (650 mg oral Given 09/24/24 1509)  LORazepam (ATIVAN) tablet 0.5 mg (0.5 mg oral Given 09/24/24 1509)    Patient management required discussion with the following services or consulting groups: None   Patient's presentation is most consistent with acute presentation with potential threat to life or bodily function.   Factors Impacting ED Encounter Risk: Discussion regarding hospitalization Results  Labs:  Abnormal Labs Reviewed  COMPREHENSIVE METABOLIC PANEL - Abnormal; Notable for  the following components:      Result Value   Chloride 109 (*)    Glucose, Random 123 (*)    Creatinine 1.27 (*)    eGFR 44 (*)    Total Protein 5.7 (*)    BUN/Creatinine Ratio 8.7 (*)    All other components within normal limits  URINALYSIS WITH REFLEX TO MICROSCOPIC - Abnormal; Notable for the following components:   Specific Gravity, Urine 1.004 (*)    Glucose, Urine >1000 (*)    All other components within normal limits   Narrative:    Microscopic not indicated  CBC WITH DIFFERENTIAL - Abnormal; Notable for the following components:   WBC 3.92 (*)    RBC 3.72 (*)    Mean Corpuscular Volume (MCV) 100.3 (*)    Mean Corpuscular Hemoglobin (MCH) 33.4 (*)    Platelet Count (PLT) 120 (*)    Lymphocytes # 0.80 (*)    All other components within normal limits    Radiology:  CT Chest Abdomen Pelvis W Contrast  Final Result by Glori Good, MD (11/27 1423)  Date of Service: 2024-09-24 12:36:00    EXAM:  CT Chest with Intravenous Contrast.   CT Abdomen and Pelvis with Intravenous Contrast    CLINICAL HISTORY:  fall pain everywhere.     TECHNIQUE:  Axial computed tomography images of the chest, abdomen and pelvis with   intravenous contrast.    CONTRAST:  With; right Forearm, 80mL Omnipaque 350, 2 mL/s      COMPARISON:  None provided.      FINDINGS:    CHEST:    LUNGS:  Small pleural plaque and parenchymal scarring right upper lobe. 3 mm   ground-glass nodule right middle lobe and 5 mm ground-glass nodule right   lower lobe. Tiny calcified granuloma right upper lobe. Mild dependent   atelectasis. No contusion.     PLEURAL SPACES:  No pleural effusion. No pneumothorax.    HEART AND MEDIASTINUM:  No cardiomegaly. No significant pericardial effusion.    LYMPH NODES:  No lymphadenopathy.    ABDOMEN AND PELVIS:    LIVER:  Unremarkable. No focal lesions.    GALLBLADDER AND BILE DUCTS:  Cholecystectomy. Mild intra and extrahepatic biliary ductal dilatation    likely chronic. The common bile duct is 11 mm.     PANCREAS:  Atrophic pancreas.     SPLEEN:  Unremarkable.    ADRENAL GLANDS:  Unremarkable.    KIDNEYS, URETERS, AND BLADDER:  Mild prominence of the bladder wall. Query cystitis.   No renal mass or hydronephrosis.     STOMACH AND BOWEL:  No obstruction. No wall thickening. No CT evidence of colitis or acute   diverticulitis.    APPENDIX:  No CT evidence for appendicitis.    PERITONEUM:  No free fluid. No free air.    LYMPH NODES:  No lymphadenopathy.    REPRODUCTIVE:  Hysterectomy.     VASCULATURE:  Mild ectasia of the infrarenal abdominal aorta. No aneurysm.     BONES AND SOFT TISSUES:  Surgical sutures midline upper abdominal wall.   Subcutaneous fluid collection over the lateral left hip, 5.9 x 2.8 x 7.2   cm likely hematoma or seroma.   No acute fracture the pelvis or hips. No acute displaced rib fracture.  Acute compression fracture of L4 with 60% height loss and fragment   displacement causing moderate to severe spinal canal stenosis.  Acute mild compression fracture at the superior endplate of T11 with   minimal vertebral body height loss and no fragment retropulsion.   Age-indeterminate compression fractures of T7 and T4 with minimal height   loss and without fragment retropulsion.   Small inguinal hernia on the right containing fat.   Small subcutaneous nodule left lower anterior abdominal wall likely   incidental.    IMPRESSION:    1. Subcutaneous fluid collection over the lateral left hip, 5.9 x 2.8 x   7.2 cm likely hematoma or seroma. No acute pelvic or hip fracture.  2. Acute compression fracture of L4 with 60% height loss and fragment   displacement causing moderate to severe spinal canal stenosis.   3. Acute mild compression fracture at the superior endplate of T11 with   minimal vertebral body height loss and no fragment retropulsion.   4. Age-indeterminate compression fractures of T7 and T4  with minimal   height loss and without fragment retropulsion.   5. Ground-glass nodules within the right lung measuring up to 5 mm. In a   high risk patient follow-up CT in 3-6 months the stable consider CT at 2   years and 4 years is recommended follow-up per Fleischner. No required   follow-up for low risk patients.   6. No acute injury to the viscera of the abdomen and pelvis.    Electronically signed by: Glori Good, MD on 09/24/2024 02:23:56 PM   [facility]  Per PQRS, all CT exams are performed using one or more of the following   dose reduction techniques: automated exposure control, adjustment of the   mA and/or kV according to patient size, or use of iterative reconstruction   technique.      CT Spine Thoracic Lumbar Reformats W Contrast  Final Result by Glori Good, MD (11/27 1428)  Addendum (preliminary) 1 of 1 by Glori Good, MD (11/27 1428)  Date of Service: 2024-09-24 12:36:00    -----ADDENDUM-----:    This report was discussed with GUARINO SHARON JOANN, DO by Glori Good,   MD on Sep 24, 2024 14:27:00 EST.    Electronically signed by: Sherlyn Slough on 09/24/2024 02:28:04 PM   [facility]    -----ORIGINAL REPORT-----:    EXAM:  CT Thoracic Spine with Intravenous Contrast.  CT lumbar spine with intravenous contrast.    CLINICAL HISTORY:  fall.     TECHNIQUE:  Axial computed tomography images of the thoracolumbar spine with   intravenous contrast. Sagittal and coronal reformations performed.    COMPARISON:  None provided.      FINDINGS:    BONES:  Normal vertebral alignment throughout the thoracolumbar spine. Acute   compression fracture of L4 with 60% vertebral body height loss and   fragment displacement at the superior endplate causing moderate to severe   spinal canal stenosis. Underlying sclerosis within the vertebral body is   likely related to a chronic fracture or degenerative change.   Mild compression fracture at the  superior  endplate of T11 is likely acute.   There is 10% vertebral body height loss and there is no fragment   retropulsion.   Age indeterminate mild compression fracture at the superior endplate T7   with less than 10% vertebral body height loss and without fragment   retropulsion.   Age indeterminate mild compression fracture at the superior endplate of T4   with 10% vertebral body height loss and without fragment retropulsion.     DISCS/DEGENERATIVE CHANGES:  L5-S1 facet arthropathy and focal central disc protrusion with mild spinal   canal and no neural foraminal stenosis.   L4-5 facet arthropathy and ligamentum flavum hypertrophy and shallow disc   protrusion causes mild spinal canal stenosis. No neural foraminal   stenosis.  L3-4 retropulsion of a fracture fragment at the superior endplate of L4   causes moderate to severe spinal canal stenosis. There is also broad-based   disc protrusion and facet arthropathy. No definite neural foraminal   stenosis.  L2-3 mild facet arthropathy. Posterior osteophytes. No significant   stenosis.   L1-2 no stenosis.   T12-L1 no stenosis.     SOFT TISSUES:  5 mm nodule within the right lower lobe. See CT chest abdomen and pelvis.     IMPRESSION:    Acute compression fracture of L4 with 60% vertebral body height loss and   fragment displacement at the superiorendplate causing moderate to severe   spinal canal stenosis.   Mild compression fracture at the superior endplate of T11 is likely acute.   There is 10% vertebral body height loss and there is no fragment   retropulsion.  Age indeterminate mild compression fracture at the superior endplate T7   with less than 10% vertebral body height loss and without fragment   retropulsion.   Age indeterminate mild compression fracture at the superior endplate of T4   with 10% vertebral body height loss and without fragment retropulsion.    Electronically signed by: Glori Good, MD on 09/24/2024 02:15:10  PM   [facility]  Per PQRS, all CT exams are performed using one or more of the following   dose reduction techniques: automated exposure control, adjustment of the   mA and/or kV according to patient size, or use of iterative reconstruction   technique.      Final  Date of Service: 2024-09-24 12:36:00    EXAM:  CT Thoracic Spine with Intravenous Contrast.  CT lumbar spine with intravenous contrast.    CLINICAL HISTORY:  fall.     TECHNIQUE:  Axial computed tomography images of the thoracolumbar spine with   intravenous contrast. Sagittal and coronal reformations performed.    COMPARISON:  None provided.      FINDINGS:    BONES:  Normal vertebral alignment throughout the thoracolumbar spine. Acute   compression fracture of L4 with 60% vertebral body height loss and   fragment displacement at the superior endplate causing moderate to severe   spinal canal stenosis. Underlying sclerosis within the vertebral body is   likely related to a chronic fracture or degenerative change.   Mild compression fracture at the superior endplate of T11 is likely acute.   There is 10% vertebral body height loss and there is no fragment   retropulsion.   Age indeterminate mild compression fracture at the superior endplate T7   with less than 10% vertebral body height loss and without fragment   retropulsion.   Age indeterminate mild compression fracture at the superior endplate of T4  with 10% vertebral body height loss and without fragment retropulsion.     DISCS/DEGENERATIVE CHANGES:  L5-S1 facet arthropathy and focal central disc protrusion with mild spinal   canal and no neural foraminal stenosis.   L4-5 facet arthropathy and ligamentum flavum hypertrophy and shallow disc   protrusion causes mild spinal canal stenosis. No neural foraminal   stenosis.  L3-4 retropulsion of a fracture fragment at the superior endplate of L4   causes moderate to severe spinal canal stenosis. There is  also broad-based   disc protrusion and facet arthropathy. No definite neural foraminal   stenosis.  L2-3 mild facet arthropathy. Posterior osteophytes. No significant   stenosis.   L1-2 no stenosis.   T12-L1 no stenosis.     SOFT TISSUES:  5 mm nodule within the right lower lobe. See CT chest abdomen and pelvis.     IMPRESSION:    Acute compression fracture of L4 with 60% vertebral body height loss and   fragment displacement at the superiorendplate causing moderate to severe   spinal canal stenosis.   Mild compression fracture at the superior endplate of T11 is likely acute.   There is 10% vertebral body height loss and there is no fragment   retropulsion.  Age indeterminate mild compression fracture at the superior endplate T7   with less than 10% vertebral body height loss and without fragment   retropulsion.   Age indeterminate mild compression fracture at the superior endplate of T4   with 10% vertebral body height loss and without fragment retropulsion.    Electronically signed by: Glori Good, MD on 09/24/2024 02:15:10 PM   [facility]  Per PQRS, all CT exams are performed using one or more of the following   dose reduction techniques: automated exposure control, adjustment of the   mA and/or kV according to patient size, or use of iterative reconstruction   technique.      XR Knee 3 Views Right  Final Result by Marinda CHRISTELLA Silversmith, MD (11/27 1325)  Date of Service: 2024-09-24 12:36:00    EXAM:  XR right Knee, 3  View.    CLINICAL HISTORY:  fall.     COMPARISON:  None provided.      FINDINGS:    BONES:  No acute fracture or focal osseous lesion.    JOINTS:  No knee effusion. No dislocation. The joint spaces are normal.    SOFT TISSUES:  The soft tissues are unremarkable.    IMPRESSION:    No acute osseous abnormality.    Electronically signed by: Marinda Silversmith, MD on 09/24/2024 01:25:33 PM   US [facility]ette    XR Knee 3 Views Left  Final Result by  Marinda CHRISTELLA Silversmith, MD (11/27 1325)  Date of Service: 2024-09-24 12:36:00    EXAM:  XR left Knee, 3  View.    CLINICAL HISTORY:  fall.     COMPARISON:  None provided.      FINDINGS:    BONES:  No acute fracture or focal osseous lesion.    JOINTS:  No knee effusion. No dislocation. The joint spaces are normal.    SOFT TISSUES:  The soft tissues are unremarkable.    IMPRESSION:    No acute osseous abnormality.    Electronically signed by: Marinda Silversmith, MD on 09/24/2024 01:25:01 PM   [facility]    CT Spine Cervical WO Contrast  Final Result by Marinda CHRISTELLA Silversmith, MD (11/27 1129)  Date of Service: 2024-09-24 10:08:00    EXAM:  CT Cervical  Spine Without Intravenous Contrast.    CLINICAL HISTORY:  Neck pain, chronic.     TECHNIQUE:  Axial computed tomography images of the cervical spine without intravenous   contrast. Sagittal and coronal reformations performed.    COMPARISON:  None provided.      FINDINGS:    BONES:  No acute fracture or focal osseous lesion.  There is minimal compression   deformity anterior superior endplate of C7 which appears old.  Moderate dextroscoliosis is noted.  Minimal anterior subluxation is noted   C3 on C4 and C7 on T1.    DISCS / DEGENERATIVE CHANGES:  There is moderate degenerative change throughout the cervical spine,   particularly at C1-2.  There is narrowing at the C5-6 interspace and at   T1-T2.  No significant central canal or neural foraminal stenosis.    SOFT TISSUES:  No prevertebral soft tissue swelling. No apical pneumothorax.    IMPRESSION:    No acute cervical spine fracture or dislocation.    Electronically signed by: Marinda Silversmith, MD on 09/24/2024 11:29:45 AM   [facility]  Per PQRS, all CT exams are performed using one or more of the following   dose reduction techniques: automated exposure control, adjustment of the   mA and/or kV according to patient size, or use of iterative reconstruction   technique.       CT Head WO Contrast W Quant CT Tiss Character When Performed  Final Result by Marinda CHRISTELLA Silversmith, MD (11/27 1111)  Date of Service: 2024-09-24 10:08:00    EXAM:  CT Head with and without Intravenous Contrast.    CLINICAL HISTORY:  Memory loss.     TECHNIQUE:  Axial computed tomography images of the head/brain with and without   intravenous contrast.    CONTRAST:  Contrast was administered.      COMPARISON:  None provided.      FINDINGS:    BRAIN:  No acute intraparenchymal hemorrhage. No mass lesion. No abnormal   enhancement. No CT evidence for acute territorial infarct. No midline   shift or extra-axial collection.    VENTRICLES:  No hydrocephalus.    ORBITS:  The orbits are unremarkable.    SINUSES AND MASTOIDS:  The paranasal sinuses and mastoid air cells are clear.    SOFT TISSUES:  No significant facial or scalp soft tissue swelling evident. No radiopaque   foreign body is seen.    BONES:  No acute skull fracture.    IMPRESSION:    No acute intracranial abnormality.        Chronic age-related vascular changes    Electronically signed by: Marinda Silversmith, MD on 09/24/2024 11:11:04 AM   [facility]  Per PQRS, all CT exams are performed using one or more of the following   dose reduction techniques: automated exposure control, adjustment of the   mA and/or kV according to patient size, or use of iterative reconstruction   technique.         No data recorded  CHA2DS2-VASc Score: N/A  Glasgow Coma Scale Score: 14                  Final Clinical Impressions(s) 1. Fall, initial encounter     ED Disposition:  Discharge  ED Prescriptions   None      This note was dictated using Dragon voice recognition software; please excuse any unintentional spelling or grammatical discrepancies.  Reena Grout Paullina, DO 09/24/2024       [1] Past Medical History: Diagnosis  Date   Acid reflux    Acquired hypothyroidism    Acute blood loss  anemia    Allergic rhinitis    Arthritis, multiple joint involvement    At high risk for falls per Deloris fall risk assessment scale    Benign hypertension with CKD (chronic kidney disease) stage IV    (CMD)    Bilateral dry eyes    Bipolar 1 disorder    (CMD)    Cataract    Chronic bilateral back pain    CKD (chronic kidney disease) stage 4, GFR 15-29 ml/min    (CMD)    Degenerative disc disease, lumbar    Dementia (CMD)    Diuretic-induced hypokalemia    Elevated serum GGT level    Fibromyalgia    Hammer toe of left foot    Hammer toe of right foot    Keratoconjunctivitis sicca of both eyes not specified as Sjogren's    Mild intermittent asthma without complication (CMD)    Multiple fractures of ribs of right side    Polyneuropathy due to secondary diabetes mellitus    (CMD)    Presbyopia OU    Primary osteoarthritis of first carpometacarpal joint of left hand    Primary osteoarthritis of first carpometacarpal joint of right hand    Proximal muscle weakness    Schizoaffective disorder, bipolar type    (CMD)    Spondylosis of lumbar spine    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   Sternal fracture    Type 2 diabetes mellitus with stage 3b chronic kidney disease, without long-term current use of insulin    (CMD) 01/05/2020   Type 2 diabetes mellitus with stage 4 chronic kidney disease, without long-term current use of insulin    (CMD)    Unsteady gait   [2] Past Surgical History: Procedure Laterality Date   CHOLECYSTECTOMY     Procedure: CHOLECYSTECTOMY   EPIDURAL BLOCK INJECTION Bilateral 09/20/2020   Procedure: LUMBAR MEDIAL BRANCH BLOCK L4-S1 bilateral #1/2 [place] 2%;  Surgeon: Janus Von Adora Blanch, MD;  Location: [facility];  Service: Pain Services;  Laterality: Bilateral;   EPIDURAL BLOCK INJECTION Bilateral 09/29/2020   Procedure: LUMBAR MEDIAL BRANCH BLOCK L4-S1 Bilateral 1/2 2% Lidocaine ;  Surgeon: Janus Von Adora Blanch, MD;  Location: [facility];  Service: Pain Services;  Laterality: Bilateral;   EPIDURAL BLOCK INJECTION Bilateral 10/18/2020   Procedure: LUMBAR MEDIAL BRANCH BLOCK #2/2 L4-S1 BILATERAL;  Surgeon: Janus Von Adora Blanch, MD;  Location: [facility];  Service: Pain Services;  Laterality: Bilateral;   HYSTERECTOMY      Procedure: HYSTERECTOMY   RADIOFREQUENCY ABLATION NERVES Bilateral 11/24/2020   Procedure: LUMBAR RADIOFREQUENCY L4-S1 Bilateral RFAs;  Surgeon: Janus Von Adora Blanch, MD;  Location: [facility];  Service: Pain Services;  Laterality: Bilateral;   RADIOFREQUENCY ABLATION NERVES N/A 04/12/2022   Procedure: RADIOFREQUENCY ABLATION LUMBAR FACET JOINTS Bilateral repeat L4-S1;  Surgeon: Janus Von Adora Blanch, MD;  Location: [facility];  Service: Pain Services;  Laterality: N/A;   TUBAL LIGATION     Procedure: TUBAL LIGATION  [3] Family History Problem Relation Name Age of Onset   Glaucoma Maternal Grandmother     Arthritis Mother     Heart failure Father     Heart disease Father     Hyperlipidemia Father     Hypertension Father     Cancer Neg Hx     Macular degeneration Neg Hx     Stroke Neg  Hx     Diabetes Neg Hx    [4] Social History Tobacco Use   Smoking status: Never   Smokeless tobacco: Never  Substance Use Topics   Alcohol use: No   Drug use: No

## 2024-11-07 ENCOUNTER — Emergency Department (HOSPITAL_COMMUNITY)

## 2024-11-07 ENCOUNTER — Emergency Department (HOSPITAL_COMMUNITY)
Admission: EM | Admit: 2024-11-07 | Discharge: 2024-11-07 | Disposition: A | Discharge status: Skilled Nursing Facility | Source: Skilled Nursing Facility | Attending: Emergency Medicine | Admitting: Emergency Medicine

## 2024-11-07 DIAGNOSIS — S40011A Contusion of right shoulder, initial encounter: Secondary | ICD-10-CM | POA: Insufficient documentation

## 2024-11-07 DIAGNOSIS — R519 Headache, unspecified: Secondary | ICD-10-CM | POA: Diagnosis present

## 2024-11-07 DIAGNOSIS — S0181XA Laceration without foreign body of other part of head, initial encounter: Secondary | ICD-10-CM | POA: Insufficient documentation

## 2024-11-07 DIAGNOSIS — E119 Type 2 diabetes mellitus without complications: Secondary | ICD-10-CM | POA: Diagnosis not present

## 2024-11-07 DIAGNOSIS — S60011A Contusion of right thumb without damage to nail, initial encounter: Secondary | ICD-10-CM | POA: Insufficient documentation

## 2024-11-07 DIAGNOSIS — S0231XA Fracture of orbital floor, right side, initial encounter for closed fracture: Secondary | ICD-10-CM | POA: Insufficient documentation

## 2024-11-07 DIAGNOSIS — I10 Essential (primary) hypertension: Secondary | ICD-10-CM | POA: Diagnosis not present

## 2024-11-07 DIAGNOSIS — W19XXXA Unspecified fall, initial encounter: Secondary | ICD-10-CM | POA: Diagnosis not present

## 2024-11-07 DIAGNOSIS — S0083XA Contusion of other part of head, initial encounter: Secondary | ICD-10-CM

## 2024-11-07 DIAGNOSIS — F0390 Unspecified dementia without behavioral disturbance: Secondary | ICD-10-CM | POA: Diagnosis not present

## 2024-11-07 LAB — I-STAT CHEM 8, ED
BUN: 13 mg/dL (ref 8–23)
Calcium, Ion: 1.2 mmol/L (ref 1.15–1.40)
Chloride: 110 mmol/L (ref 98–111)
Creatinine, Ser: 1.2 mg/dL — ABNORMAL HIGH (ref 0.44–1.00)
Glucose, Bld: 77 mg/dL (ref 70–99)
HCT: 39 % (ref 36.0–46.0)
Hemoglobin: 13.3 g/dL (ref 12.0–15.0)
Potassium: 3.9 mmol/L (ref 3.5–5.1)
Sodium: 144 mmol/L (ref 135–145)
TCO2: 19 mmol/L — ABNORMAL LOW (ref 22–32)

## 2024-11-07 LAB — CBC WITH DIFFERENTIAL/PLATELET
Abs Immature Granulocytes: 0.07 K/uL (ref 0.00–0.07)
Basophils Absolute: 0.1 K/uL (ref 0.0–0.1)
Basophils Relative: 1 %
Eosinophils Absolute: 0 K/uL (ref 0.0–0.5)
Eosinophils Relative: 1 %
HCT: 38.8 % (ref 36.0–46.0)
Hemoglobin: 12.6 g/dL (ref 12.0–15.0)
Immature Granulocytes: 2 %
Lymphocytes Relative: 20 %
Lymphs Abs: 0.9 K/uL (ref 0.7–4.0)
MCH: 33.7 pg (ref 26.0–34.0)
MCHC: 32.5 g/dL (ref 30.0–36.0)
MCV: 103.7 fL — ABNORMAL HIGH (ref 80.0–100.0)
Monocytes Absolute: 0.5 K/uL (ref 0.1–1.0)
Monocytes Relative: 11 %
Neutro Abs: 2.9 K/uL (ref 1.7–7.7)
Neutrophils Relative %: 65 %
Platelets: 116 K/uL — ABNORMAL LOW (ref 150–400)
RBC: 3.74 MIL/uL — ABNORMAL LOW (ref 3.87–5.11)
RDW: 14.9 % (ref 11.5–15.5)
WBC: 4.5 K/uL (ref 4.0–10.5)
nRBC: 0 % (ref 0.0–0.2)

## 2024-11-07 MED ORDER — CLONAZEPAM 0.5 MG PO TABS
1.0000 mg | ORAL_TABLET | Freq: Once | ORAL | Status: AC
  Administered 2024-11-07: 1 mg via ORAL
  Filled 2024-11-07: qty 2

## 2024-11-07 MED ORDER — LIDOCAINE-EPINEPHRINE-TETRACAINE (LET) TOPICAL GEL
3.0000 mL | Freq: Once | TOPICAL | Status: AC
  Administered 2024-11-07: 3 mL via TOPICAL
  Filled 2024-11-07: qty 3

## 2024-11-07 MED ORDER — LIDOCAINE-EPINEPHRINE (PF) 2 %-1:200000 IJ SOLN
10.0000 mL | Freq: Once | INTRAMUSCULAR | Status: AC
  Administered 2024-11-07: 10 mL
  Filled 2024-11-07: qty 20

## 2024-11-07 MED ORDER — HYDROCODONE-ACETAMINOPHEN 5-325 MG PO TABS
1.0000 | ORAL_TABLET | Freq: Once | ORAL | Status: AC
  Administered 2024-11-07: 1 via ORAL
  Filled 2024-11-07: qty 1

## 2024-11-07 NOTE — ED Triage Notes (Addendum)
 Pt BIB from [facility] for unwitnessed fall, no thinners, no LOC.  Lac to lateral aspect of R. Eye and some crepitus to lower orbital area.  Skin tear to L. Wrist.  Bleeding is controlled. No other obvious acute injuries.  Pt does not endorse neck pain but in c-collar per mechanism and dementia.   EMS has some older bruising and endorses freq falls recently.  PT is a bit emotive at baseline.   PT has hx of dementia but was able to endorse a mechanical trip and fall.   134/43 HR 79 98% RA, CBG 84  Bruising noted under right eye and blood in the sclera of right eye. Pupils PERRLA.

## 2024-11-07 NOTE — ED Provider Notes (Signed)
 " [place] EMERGENCY DEPARTMENT AT [facility] Provider Note   CSN: [id number] Arrival date & time: 11/07/24  1051     Patient presents with: No chief complaint on file.   Marie Sandoval is a 79 y.o. female.   Patient is a 79 year old female with a history of hypertension, diabetes, thyroid disease, dementia who lives in a skilled facility who is presenting today after an unwitnessed fall.  When asking the patient she says she fell face first but she cannot remember why she fell.  She still thinks that she is living in an upstairs apartment.  She is complaining of pain to her head and her face.  She is very tearful.  It is difficult to discern exactly where she is having pain because she just continually goes back and talks about her prior life.  Her tetanus shot is up-to-date.  Patient does have a problem with frequent falls.  She is not on any anticoagulation.  The history is provided by the patient, the EMS personnel and the nursing home.       Prior to Admission medications  Medication Sig Start Date End Date Taking? Authorizing Provider  buPROPion  (WELLBUTRIN  XL) 150 MG 24 hr tablet Take 150 mg by mouth daily. 10/06/24  Yes [provider]  sertraline (ZOLOFT) 100 MG tablet Take 100 mg by mouth daily. 10/27/24  Yes [provider]  acetaminophen  (TYLENOL ) 500 MG tablet Take 1,000 mg by mouth in the morning, at noon, and at bedtime.    [provider]  albuterol (VENTOLIN HFA) 108 (90 Base) MCG/ACT inhaler Inhale 2 puffs into the lungs every 6 (six) hours as needed for wheezing.    [provider]  amLODipine  (NORVASC ) 5 MG tablet Take 1 tablet by mouth daily. 12/21/20   [provider]  azelastine (OPTIVAR) 0.05 % ophthalmic solution INSTILL 1 DROP INTO EACH EYE TWICE DAILY 08/09/15   [provider]  buPROPion  (WELLBUTRIN  XL) 300 MG 24 hr tablet Take 300 mg by mouth every morning. 04/25/20   [provider]   clonazePAM  (KLONOPIN ) 1 MG tablet Take 1 mg by mouth in the morning, at noon, and at bedtime.    [provider]  diclofenac Sodium (VOLTAREN ARTHRITIS PAIN) 1 % GEL Apply 2 g topically in the morning, at noon, and at bedtime. Apply to right shoulder for pain    [provider]  diclofenac Sodium (VOLTAREN) 1 % GEL Apply 2 g topically in the morning, at noon, and at bedtime. Apply to right knee pain    [provider]  fluticasone (FLONASE) 50 MCG/ACT nasal spray 2 sprays by Each Nare route 2 times daily. 01/25/17   [provider]  glipiZIDE  (GLUCOTROL ) 10 MG tablet Take 1 tablet by mouth daily. Patient not taking: Reported on 06/03/2024 02/07/17   [provider]  JARDIANCE 25 MG TABS tablet Take 25 mg by mouth daily.    [provider]  levothyroxine  (SYNTHROID ) 75 MCG tablet TAKE 1 TABLET BY MOUTH ONCE DAILY AT 6 AM 10/10/16   [provider]  linagliptin  (TRADJENTA ) 5 MG TABS tablet Take 1 tablet by mouth daily. 12/21/20   [provider]  lisinopril  (ZESTRIL ) 5 MG tablet Take 1 tablet by mouth daily. Patient not taking: Reported on 06/03/2024 10/10/16   [provider]  loperamide (IMODIUM) 2 MG capsule Take 2 mg by mouth as needed for diarrhea or loose stools.    [provider]  melatonin 5  MG TABS Take 5 mg by mouth at bedtime.    [provider]  montelukast (SINGULAIR) 10 MG tablet Take 10 mg by mouth at bedtime.    [provider]  omeprazole (PRILOSEC) 40 MG capsule Take 1 capsule by mouth in the morning and at bedtime. 01/09/21   [provider]  polyethylene glycol (MIRALAX / GLYCOLAX) 17 g packet Take 17 g by mouth daily as needed for mild constipation.    [provider]  Probiotic, Lactobacillus, CAPS Take 1 capsule by mouth daily.    [provider]  QUEtiapine  (SEROQUEL ) 100 MG tablet Take 100 mg by mouth 2 (two) times daily.    [provider]   sertraline (ZOLOFT) 50 MG tablet Take 50 mg by mouth at bedtime.    [provider]  temazepam  (RESTORIL ) 15 MG capsule Take 1 capsule by mouth at bedtime. Patient not taking: Reported on 06/03/2024 04/26/20   [provider]  traMADol (ULTRAM) 50 MG tablet Take 50 mg by mouth 2 (two) times daily. May be given 1 tablet every 12 hours as needed for pain. Do not within 6 hours of scheduled dose    [provider]  traZODone  (DESYREL ) 100 MG tablet Take 100 mg by mouth at bedtime. 06/05/20   [provider]    Allergies: Buprenorphine, Gabapentin, Oxycodone-aspirin, Sulfamethoxazole, Oxycodone, and Sulfa antibiotics    Review of Systems  Updated Vital Signs BP 138/75   Pulse 80   Temp 98 F (36.7 C) (Oral)   Resp (!) 21   SpO2 100%   Physical Exam Vitals and nursing note reviewed.  Constitutional:      General: She is not in acute distress.    Appearance: She is well-developed.  HENT:     Head: Normocephalic.     Comments: Large hematoma noted over the right side of the face and forehead.  Laceration to the right eyebrow area as well as the right lateral cheek under the eye.    Right Ear: Tympanic membrane normal.     Left Ear: Tympanic membrane normal.  Eyes:     Conjunctiva/sclera: Conjunctivae normal.     Pupils: Pupils are equal, round, and reactive to light.     Comments: Subconjunctival hemorrhage noted to the right eye.  Pupils are reactive.  Patient will not follow test to check for extraocular movements  Cardiovascular:     Rate and Rhythm: Normal rate and regular rhythm.     Heart sounds: No murmur heard. Pulmonary:     Effort: Pulmonary effort is normal. No respiratory distress.     Breath sounds: Normal breath sounds. No wheezing or rales.  Abdominal:     General: There is no distension.     Palpations: Abdomen is soft.     Tenderness: There is no abdominal tenderness. There is no guarding or rebound.  Musculoskeletal:         General: Tenderness present. Normal range of motion.     Cervical back: Normal range of motion and neck supple.     Comments: Bruising noted over the right shoulder with some mild pain with range of motion.  Also bruising to the right thumb and appears tender with palpation.  No obvious wrist injury or elbow injury.  Skin:    General: Skin is warm and dry.     Findings: No erythema or rash.  Neurological:     Mental Status: She is alert and oriented to person, place, and time. Mental  status is at baseline.  Psychiatric:        Behavior: Behavior normal.     (all labs ordered are listed, but only abnormal results are displayed) Labs Reviewed  CBC WITH DIFFERENTIAL/PLATELET - Abnormal; Notable for the following components:      Result Value   RBC 3.74 (*)    MCV 103.7 (*)    Platelets 116 (*)    All other components within normal limits  I-STAT CHEM 8, ED - Abnormal; Notable for the following components:   Creatinine, Ser 1.20 (*)    TCO2 19 (*)    All other components within normal limits  URINALYSIS, W/ REFLEX TO CULTURE (INFECTION SUSPECTED)    EKG: None  Radiology: CT Maxillofacial Wo Contrast Result Date: 11/07/2024 EXAM: CT OF THE FACE WITHOUT CONTRAST 11/07/2024 12:59:13 PM TECHNIQUE: CT of the face was performed without the administration of intravenous contrast. Multiplanar reformatted images are provided for review. Automated exposure control, iterative reconstruction, and/or weight based adjustment of the mA/kV was utilized to reduce the radiation dose to as low as reasonably achievable. COMPARISON: CT maxillofacial 09/01/2023. CLINICAL HISTORY: Facial trauma, blunt. FINDINGS: FACIAL BONES: Acute right orbital floor fracture with 1 to 2 mm of depression. Absent maxillary dentition. Multiple caries involving remaining mandibular teeth. No mandibular dislocation. No suspicious bone lesion. ORBITS: Globes are intact. Right periorbital soft tissue swelling with a small hematoma  inferior to the orbit and a laceration superior to the orbit. No herniation of orbital contents or retrobulbar hematoma associated with the above-described right orbital floor fracture. SINUSES AND MASTOIDS: Mild scattered mucosal thickening in the paranasal sinuses. Small fluid level in the right maxillary sinus. Clear mastoid air cells and middle ear cavities. IMPRESSION: 1. Acute, minimally displaced right orbital floor fracture. 2. Right periorbital soft tissue swelling, hematoma, and laceration. Electronically signed by: Dasie Hamburg MD MD 11/07/2024 02:07 PM EST RP Workstation: [id number]   CT Head Wo Contrast Result Date: 11/07/2024 EXAM: CT HEAD WITHOUT CONTRAST 11/07/2024 12:59:13 PM TECHNIQUE: CT of the head was performed without the administration of intravenous contrast. Automated exposure control, iterative reconstruction, and/or weight based adjustment of the mA/kV was utilized to reduce the radiation dose to as low as reasonably achievable. COMPARISON: Head CT 08/29/2024 and MRI 10/05/2022. CLINICAL HISTORY: Head trauma, minor (Age >= 65y). FINDINGS: BRAIN AND VENTRICLES: There is no evidence of an acute infarct, intracranial hemorrhage, mass, midline shift, hydrocephalus, or extra-axial fluid collection. There is mild cerebral atrophy. Cerebral white matter hypodensities are unchanged and nonspecific but compatible with mild chronic small vessel ischemic disease. Calcified atherosclerosis at the skull base. ORBITS: Reported on today's separate maxillofacial CT. SINUSES: Reported on today's separate maxillofacial CT. SOFT TISSUES AND SKULL: Right periorbital hematoma and laceration. No skull fracture. IMPRESSION: 1. No acute intracranial abnormality. Electronically signed by: Dasie Hamburg MD MD 11/07/2024 01:58 PM EST RP Workstation: [id number]   CT Cervical Spine Wo Contrast Result Date: 11/07/2024 EXAM: CT CERVICAL SPINE WITHOUT CONTRAST 11/07/2024 12:59:13 PM TECHNIQUE: CT of the cervical spine  was performed without the administration of intravenous contrast. Multiplanar reformatted images are provided for review. Automated exposure control, iterative reconstruction, and/or weight based adjustment of the mA/kV was utilized to reduce the radiation dose to as low as reasonably achievable. COMPARISON: CT cervical spine 08/29/2024. CLINICAL HISTORY: Neck trauma. FINDINGS: BONES AND ALIGNMENT: Mild right convex curvature of the cervical spine. Unchanged trace anterolisthesis of C3 on C4, C4 on C5, and C7 on T1. No acute fracture or suspicious lesion.  Unchanged chronic anterior wedging of the C7 vertebral body. DEGENERATIVE CHANGES: Advanced median and left lateral C1-C2 arthropathy. Partial interbody and facet ankylosis at C2-C3, C3-C4, and C5-C6. Widespread advanced cervical facet arthrosis. No evidence of high grade spinal canal stenosis. Asymmetrically advanced neural foraminal stenosis on the left at C3-C4 and on the right at C4-C5. SOFT TISSUES: No prevertebral soft tissue swelling. IMPRESSION: 1. No acute cervical spine fracture or traumatic malalignment. Electronically signed by: Dasie Hamburg MD MD 11/07/2024 01:55 PM EST RP Workstation: [id number]   DG Hand Complete Right Result Date: 11/07/2024 CLINICAL DATA:  Status post fall. EXAM: RIGHT HAND - COMPLETE 3+ VIEW COMPARISON:  None Available. FINDINGS: There is no evidence of acute fracture or dislocation. A radiopaque ring is seen overlying the proximal phalanx of the fifth right finger which is subsequently limited in evaluation. A chronic fracture deformity is seen involving the mid and distal portions of the fifth right metacarpal. Chronic and marked severity degenerative changes are present involving the first carpometacarpal joint. Soft tissues are unremarkable. IMPRESSION: Chronic and degenerative changes without evidence of acute fracture or dislocation. Electronically Signed   By: Suzen Dials M.D.   On: 11/07/2024 12:37   DG Shoulder  Right Result Date: 11/07/2024 CLINICAL DATA:  Status post fall. EXAM: RIGHT SHOULDER - 2+ VIEW COMPARISON:  None Available. FINDINGS: There is no evidence of an acute fracture or dislocation. Chronic right rib fractures are noted. There is no evidence of arthropathy or other focal bone abnormality. Soft tissues are unremarkable. IMPRESSION: 1. No acute fracture or dislocation. 2. Chronic right rib fractures. Electronically Signed   By: Suzen Dials M.D.   On: 11/07/2024 12:35     Procedures  LACERATION REPAIR Performed by: [facility] Authorized by: Benton Shone Consent: Verbal consent obtained. Risks and benefits: risks, benefits and alternatives were discussed Consent given by: patient Patient identity confirmed: provided demographic data Prepped and Draped in normal sterile fashion Wound explored  Laceration Location: right upper cheek under the eye  Laceration Length: 2cm  No Foreign Bodies seen or palpated  Anesthesia: topical Local anesthetic: LET Anesthetic total: 1 ml  Irrigation method: syringe Amount of cleaning: standard  Skin closure: 6.0 prolene  Number of sutures: 6  Technique: simple interrupted  Patient tolerance: Patient tolerated the procedure well with no immediate complications. LACERATION REPAIR Performed by: [facility] Authorized by: Benton Shone Consent: Verbal consent obtained. Risks and benefits: risks, benefits and alternatives were discussed Consent given by: patient Patient identity confirmed: provided demographic data Prepped and Draped in normal sterile fashion Wound explored  Laceration Location: right lower forehead  Laceration Length: 3cm  No Foreign Bodies seen or palpated  Anesthesia:topical  Local anesthetic:LET Anesthetic total: 1 ml  Irrigation method: syringe Amount of cleaning: standard  Skin closure: 6.0 prolene  Number of sutures: 8  Technique: simple interrupted  Patient tolerance:  Patient tolerated the procedure well with no immediate complications.  Medications Ordered in the ED  clonazePAM  (KLONOPIN ) tablet 1 mg (has no administration in time range)  HYDROcodone -acetaminophen  (NORCO/VICODIN) 5-325 MG per tablet 1 tablet (1 tablet Oral Given 11/07/24 1236)  lidocaine -EPINEPHrine  (XYLOCAINE  W/EPI) 2 %-1:200000 (PF) injection 10 mL (10 mLs Infiltration Given 11/07/24 1246)  lidocaine -EPINEPHrine -tetracaine  (LET) topical gel (3 mLs Topical Given 11/07/24 1245)  lidocaine -EPINEPHrine -tetracaine  (LET) topical gel (3 mLs Topical Given 11/07/24 1245)  Medical Decision Making Amount and/or Complexity of Data Reviewed External Data Reviewed: notes. Labs: ordered. Decision-making details documented in ED Course. Radiology: ordered and independent interpretation performed. Decision-making details documented in ED Course.  Risk Prescription drug management.   Pt with multiple medical problems and comorbidities and presenting today with a complaint that caries a high risk for morbidity and mortality.  Elderly female here today with the above complaint.  She does have multiple lacerations to the face and injury.  Concern for possible facial fracture, intracranial hemorrhage or ocular injury.  Patient's pupils are reactive and lower suspicion for retrobulbar hematoma but will evaluate for orbital fracture.  Tetanus shot is up-to-date.  Wound repaired as above.  Imaging and blood work are pending.  Patient is hemodynamically stable at this time. 2:16 PM I dependently interpreted patient's labs and Chem-8 and CBC without acute findings.  I have independently visualized and interpreted pt's images today.  Head CT is negative for acute bleed and cervical spine without evidence of fracture but does show arthritis.  Radiology reports no acute intracranial or cervical pathology.  C-spine was cleared.  Facial CT with orbital floor fracture.  Radiology reports  minimally displaced orbital floor fracture.  Without herniation of orbital contents.  Wounds repaired as above.      Final diagnoses:  Fall, initial encounter  Facial laceration, initial encounter  Facial hematoma, initial encounter  Closed fracture of right orbital floor, initial encounter [facility])    ED Discharge Orders     None          Doretha Folks, MD 11/07/24 1416  "

## 2024-11-07 NOTE — ED Notes (Signed)
 Patient transported to CT

## 2024-11-07 NOTE — Discharge Instructions (Addendum)
 6 stitches in the cheek under the eye to come out in 7 days and 8 stitches in the right forehead also need to come out in 7 days.  CAT scans and blood work were normal today except for 1 bone around your eye being broken that should heal on its own.  However you were given follow-up with the specialist if you are having any issues with your vision..  No internal bleeding or broken bones.  Need to return for persistent vomiting or change in mental status.  Tylenol  500mg  as needed for pain.

## 2024-11-13 NOTE — ED Provider Notes (Signed)
 Emergency Department Provider Note  Chief Complaint  Patient presents with   Fall   History  *Chart reviewed and pertinent medical history confirmed with patient and/or external source collateral (as needed).  History of Present Illness 79 year old female with schizoaffective disorder, hypertension, COPD, diabetes, and CKD, presenting after a fall.  Patient fell today and attributes it to tripping over an object. Reports a similar fall yesterday or the day before, also due to tripping. No pain or dizziness prior to falls. Resides in a nursing home with frequent falls. Currently experiencing pain and requested medication.    Previous Chart Review  Reviewed to previous ED notes for falls within the past week  Past Medical History Medical History[1]  Past Surgical History Surgical History[2]   Medications Current Outpatient Medications  Medication Instructions   acetaminophen  (TYLENOL ) 1,000 mg, oral, 3 times daily   albuterol  HFA (PROVENTIL  HFA;VENTOLIN  HFA;PROAIR  HFA) 90 mcg/actuation inhaler 2 puffs, inhalation, Every 6 hours PRN   amLODIPine  (NORVASC ) 5 mg, oral, Daily   azelastine (OPTIVAR) 0.05 % drop ophthalmic solution 1 drop, Both Eyes, 2 times daily   benzocain-camphor-allatoin-pet (ANBESOL COLD SORE TOP) 1 Application, topical, 2 times daily   BisaCODYL (DULCOLAX) 10 mg, rectal, Every 24 hours PRN   buPROPion  (WELLBUTRIN  XL) 300 mg, Every morning   clonazePAM  (KLONOPIN ) 1 mg, oral, 3 times daily   clonazePAM  (KLONOPIN ) 1 mg, oral, Every 24 hours PRN   diclofenac sodium (VOLTAREN) 2 g, topical, 3 times daily, To right knee   diclofenac sodium (VOLTAREN) 2 g, topical, 3 times daily, To right shoulder   donepeziL  (ARICEPT ) 10 mg, oral, Nightly   fluticasone propionate (FLONASE) 50 mcg/spray nasal spray 2 sprays, Each Nostril, 2 times daily   glipiZIDE  (GLUCOTROL ) 10 mg, oral, Daily   hydrOXYzine (ATARAX) 50 mg, oral, Daily   Jardiance 25 mg, oral, Every  morning   Lactobacillus acidophilus (Probiotic) 10 billion cell cap 1 capsule, oral, Every morning   levothyroxine  (SYNTHROID ) 75 mcg tablet TAKE 1 TABLET BY MOUTH ONCE DAILY AT 6 AM   lisinopriL  (PRINIVIL ) 5 mg, oral, Daily   loperamide (IMODIUM) 2 mg, oral, Every 6 hours PRN   LORazepam  (ATIVAN ) 1 mg, oral, Every 12 hours PRN   magnesium hydroxide (MILK OF MAGNESIA) 400 mg/5 mL suspension 30 mL, oral, Every 24 hours PRN   melatonin 5 mg, oral, At bedtime   metoprolol tartrate (LOPRESSOR) 25 mg, oral, 2 times daily   montelukast  (SINGULAIR ) 10 mg tablet Take 1 tablet by mouth nightly   nystatin (MYCOSTATIN) 100,000 unit/gram ointment APPLY LIBERALLY TOPICALLY TO AFFECTED AREA FOUR TO FIVE TIMES DAILY   omeprazole (PRILOSEC) 40 mg, oral, 2 times daily   polyethylene glycol (MIRALAX ) 17 g, oral, Every 24 hours PRN   QUEtiapine  (SEROQUEL ) 100 mg, oral, 2 times daily   sertraline  (ZOLOFT ) 100 mg, Daily   sodium phosphates (FLEET ADULT) 19-7 gram/118 mL enema 133 mL, rectal, Every 24 hours PRN   temazepam  (RESTORIL ) 15 mg, oral, At bedtime   Tradjenta  5 mg, oral, Daily   traMADoL (ULTRAM) 50 mg tablet TAKE 2 TABLETS BY MOUTH TWICE DAILY AS NEEDED FOR MODERATE PAIN  (4-6)   traZODone  (DESYREL ) 100 mg, oral, Nightly   Allergies Allergies[3]  Family History Family History[4]  Social History Social History[5] Physical Exam   Vitals:   11/13/24 1614 11/13/24 1858 11/13/24 2018  BP: (!) 125/59 (!) 141/46 (!) 134/110  BP Location: Right arm Left arm Left arm  Patient Position: Sitting Sitting Sitting  Pulse: 74 77 73  Resp: 18 18 20   Temp: 98.2 F (36.8 C)    TempSrc: Oral    SpO2: 96% 96% 92%  Weight: 62 kg (136 lb 11 oz)    Height:   167.6 cm (5' 6)       Physical Exam General Appearance: bruised face Vital signs: Within normal limits. HEENT: Laceration on the back of the head.  Bruising on the right side of her face Respiratory: Normal respiratory  effort, CTAB, no stridor. Cardiovascular: Normal rate/ normal rhythm, no audible murmurs. Gastrointestinal: Abdomen soft, non-tender, non-distended, no guarding/rebound. Back, Musculoskeletal: Full range of motion in upper and lower extremities. No deformities.  Mild chest tenderness on palpation, pelvis stable but noted pain on palpation  Pleasantly demented   Medical Decision Making (MDM)   Differential Diagnosis DDX: strain, sprain, fracture, contusion, dislocation, hematoma, solid organ injury, SAH, epidural hematoma, laceration, hollow viscous organ injury, abrasion   ED Interventions and Subspecialty Consults Orders Placed This Encounter  Procedures   Laceration repair   CT Head WO Contrast W Quant CT Tiss Character When Performed   CT Facial Bones WO Contrast   CT Spine Cervical WO Contrast   XR Chest 2 Views   XR Pelvis 1-2 Views   CBC with Differential   Basic Metabolic Panel   CBC with Differential   Vitamin B12   TSH With Reflex To Free T4   Vitamin D, 25-Hydroxy   Rapid Plasma Reagin (RPR), Qualitative Test with Reflex to Titer   Hemoglobin A1C With Estimated Average Glucose   Adult Diet- Regular   Patient education (specify)   Notify Provider Patient Condition Change   Notify Provider Patient Condition Change   Notify Provider Patient Condition Change   Obtain lab: POC Glucose   Obtain lab: POC Glucose   Oral Hypoglycemia Treatment: IF Responsive Able to Swallow and NOT NPO   Oral protein/carbohydrate snack OR meal IF Responsive and NOT NPO   Ambulate   Height and weight   Intake and Output   Obtain EKG If   Initiate Adult Pressure Injury Treatment Protocol   PRN adapter/PIV   Vital Signs   Notify provider vital signs   Daily weights   Strict intake and output   Elevate HOB - 45 degrees   Turn patient   Elevate HOB   Apply foot pump   Incentive spirometry   VTE Prophylaxis Risk   Patient education (specify)    Notify Provider Patient Condition Change   Notify Provider Patient Condition Change   DNAR / Limited Scope of Treatment   Inpatient consult to Hospitalist   Inpatient consult to Virtual Patient Observer   OT eval and treat   PT eval and treat   Initiate observation status   Aspiration precautions   Fall precautions   Pressure Ulcer Precautions   Seizure precautions   Fall precautions    New Medications Ordered This Visit  Medications   acetaminophen  (TYLENOL ) tablet 650 mg   lidocaine  4%-EPINEPHrine -tetracaine  0.5% (L.E.T.) topical gel 3 mL   dextrose (D50W) 50 % injection 12.5 g   dextrose (GLUTOSE) 40 % oral gel 15 g   insulin  lispro (HumaLOG) injection 0-10 Units    Units to give if BG < 70:   Follow hypoglycemia orders    Units to give if BG 70-140:   0    Units to give if BG 141-170:   1    Units to give if BG 171-200:   2  Units to give if BG 201-230:   3    Units to give if BG 231-260:   4    Units to give if BG 261-290:   5    Units to give if BG 291-320:   6    Units to give if BG 321-350:   7    Units to give if BG 351-380:   8    Units to give if BG >380:   10    Pt NPO:   DO NOT HOLD WHILE PT IS NPO    BG > 180:   If BG>180 for 2 consecutive readings, contact provider and consider use of basal and mealtime insulin     Sliding Scale Type::   Moderate Dose SSI   amLODIPine  (NORVASC ) tablet 5 mg   buPROPion  (WELLBUTRIN  XL) 24 hr tablet 300 mg   clonazePAM  (KlonoPIN ) tablet 1 mg   donepeziL  (ARICEPT ) tablet 10 mg   hydrOXYzine (ATARAX) tablet 50 mg   levothyroxine  (SYNTHROID ) tablet 75 mcg    Adjust frequency to appropriate medication-specific SMAT.   LORazepam  (ATIVAN ) tablet 1 mg   melatonin tablet 6 mg   metoprolol tartrate (LOPRESSOR) tablet 25 mg   montelukast  (SINGULAIR ) tablet 10 mg   omeprazole (PriLOSEC) DR capsule 40 mg   QUEtiapine  (SEROquel ) tablet 100 mg   QUEtiapine  (SEROquel ) tablet 50 mg   sertraline   (ZOLOFT ) tablet 25 mg   traMADoL (ULTRAM) tablet 50 mg   traZODone  (DESYREL ) tablet 100 mg   heparin (porcine) 5,000 unit/mL injection 5,000 Units    Indication:   DVT Prophylaxis    Prophylaxis Type::   Non-Covid   insulin  lispro (HumaLOG) injection 0-10 Units    Units to give if BG < 70:   Follow hypoglycemia orders    Units to give if BG 70-140:   0    Units to give if BG 141-170:   1    Units to give if BG 171-200:   2    Units to give if BG 201-230:   3    Units to give if BG 231-260:   4    Units to give if BG 261-290:   5    Units to give if BG 291-320:   6    Units to give if BG 321-350:   7    Units to give if BG 351-380:   8    Units to give if BG >380:   10    Pt NPO:   DO NOT HOLD WHILE PT IS NPO    BG > 180:   If BG>180 for 2 consecutive readings, contact provider and consider use of basal and mealtime insulin     Sliding Scale Type::   Moderate Dose SSI   ondansetron (ZOFRAN-ODT) disintegrating tablet 4 mg   ondansetron (ZOFRAN) injection 4 mg   polyethylene glycol (GLYCOLAX ) packet 17 g   acetaminophen  (TYLENOL ) tablet 650 mg   benzocaine-menthoL (CEPACOL SORE THROAT) lozenge 1 lozenge   diphenhydrAMINE (BENADRYL) tablet/capsule 25 mg   sodium chloride  (OCEAN) 0.65 % nasal spray 1 spray   dextromethorphan-guaiFENesin (ROBITUSSIN-DM) 10-100 mg/5 mL liquid 10 mL   albuterol  sulfate 2.5 mg/0.5 mL nebulizer solution 2.5 mg    Procedures Laceration repair  Date/Time: 11/13/2024 3:51 PM  Performed by: Miquel Lax, MD Authorized by: Miquel Lax, MD   Consent:    Consent obtained:  Verbal   Consent given by:  Patient   Risks, benefits, and alternatives were discussed: yes  Risks discussed:  Infection, pain and poor cosmetic result   Alternatives discussed:  No treatment and delayed treatment Universal protocol:    Procedure explained and questions answered to patient or proxy's satisfaction: yes     Site/side marked: yes     Immediately prior to  procedure, a time out was called: yes     Patient identity confirmed:  Verbally with patient Anesthesia:    Anesthesia method:  Topical application   Topical anesthetic:  3 mL lidocaine  4%-EPINEPHrine -tetracaine  0.5% Laceration details:    Location:  Scalp   Length (cm):  6 Pre-procedure details:    Preparation:  Patient was prepped and draped in usual sterile fashion Exploration:    Hemostasis achieved with:  Direct pressure   Wound exploration: wound explored through full range of motion and entire depth of wound visualized     Wound extent: no areolar tissue violation noted, no fascia violation noted, no foreign bodies/material noted, no muscle damage noted, no nerve damage noted, no tendon damage noted, no underlying fracture noted and no vascular damage noted     Contaminated: no   Treatment:    Area cleansed with:  Saline   Amount of cleaning:  Standard   Irrigation solution:  Sterile saline   Irrigation volume:  200   Irrigation method:  Syringe and pressure wash   Visualized foreign bodies/material removed: no     Layers/structures repaired:  Kerney Kerney:    Suture size:  4-0   Suture material:  Vicryl   Suture technique:  Simple interrupted   Number of sutures:  3 Skin repair:    Repair method:  Staples   Number of staples:  7 Approximation:    Approximation:  Close Repair type:    Repair type:  Simple Post-procedure details:    Dressing:  Non-adherent dressing   Procedure completion:  Tolerated      Independent Interpretation  *All imaging, laboratory, and diagnostic studies reviewed and independently interpreted by me, pertinent findings outlined below.   Lab and Imaging Interpretation : I have independently reviewed the following labs and imaging and my interpretation is as follows leukopenia, normal tsh, BMP unremarkable, CT head with occipital/parietal laceration   Cardiac Monitoring Interpretation: no acute distress, no tachycardia, no tachypnea, no  hypotension       Assessment & Plan 79 year old female presenting with a fall.  Pleasantly demented.  Multiple falls in the past week. Will admit this patient for frequent falls at the nursing home. I think this patient needs PT evaluation w/consideration of escalation of care at the nursing home (whether that means she needs to be placed at a different facility or another alternative). She has had 3 visits for fall within a short span of time. Was seen on 10 January after a fall and needed facial sutures/orbital fracture. Was seen on 11 January after another fall with a scalp laceration and received staples. Clemens again today and has a large scalp laceration that will require galeal repair and staples again.  I reached out to social work first, who agrees with admission.   ED Course as of 11/14/24 0000  Fri Nov 13, 2024  1731 PT seen on 1/11 and found to have orbital fx and received stitches to face. [HN]  1744 Also seen on jan 10th and had a facial laceration and needed stitches [HN]  1744 Patient seen today after a fall again.  Has a large laceration that will require galeal repair and staples [HN]  1818 7  staples  [HN]    ED Course User Index [HN] Miquel Lax, MD    Dispo as outlined below.  Overall Clinical Impression:  1. Frequent falls   2. Laceration of scalp without foreign body, initial encounter    ED Disposition  Observation  Clinical Complexity Past medical/surgical history and frequent falls in the past week increasing complexity of ED encounter outlined above in HPI. As stated in HPI, further history obtained from:EMS and Nursing Facility Staff. Factors Impacting ED Encounter Risk: Discussion regarding hospitalization. Patient presentation most consistent with acute presentation with potential threat to life or bodily function.    *Provider time spent in patient care today, inclusive of (but not limited to) review of medical records, real-time clinical  reassessment, review of diagnostic studies, and discharge preparation, was greater than 30 minutes.  *This document was created using the aid of voice recognition Dragon dictation software.       [1] Past Medical History: Diagnosis Date   Acid reflux    Acquired hypothyroidism    Acute blood loss anemia    Allergic rhinitis    Arthritis, multiple joint involvement    At high risk for falls per Deloris fall risk assessment scale    Benign hypertension with CKD (chronic kidney disease) stage IV    (CMD)    Bilateral dry eyes    Bipolar 1 disorder    (CMD)    Cataract    Chronic bilateral back pain    CKD (chronic kidney disease) stage 4, GFR 15-29 ml/min    (CMD)    Degenerative disc disease, lumbar    Dementia (CMD)    Diuretic-induced hypokalemia    Elevated serum GGT level    Fibromyalgia    Hammer toe of left foot    Hammer toe of right foot    Keratoconjunctivitis sicca of both eyes not specified as Sjogren's    Mild intermittent asthma without complication (CMD)    Multiple fractures of ribs of right side    Polyneuropathy due to secondary diabetes mellitus    (CMD)    Presbyopia OU    Primary osteoarthritis of first carpometacarpal joint of left hand    Primary osteoarthritis of first carpometacarpal joint of right hand    Proximal muscle weakness    Schizoaffective disorder, bipolar type    (CMD)    Spondylosis of lumbar spine    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   Sternal fracture    Type 2 diabetes mellitus with stage 3b chronic kidney disease, without long-term current use of insulin     (CMD) 01/05/2020   Type 2 diabetes mellitus with stage 4 chronic kidney disease, without long-term current use of insulin     (CMD)    Unsteady gait   [2] Past Surgical History: Procedure Laterality Date   CHOLECYSTECTOMY     Procedure: CHOLECYSTECTOMY   EPIDURAL BLOCK INJECTION Bilateral 09/20/2020   Procedure: LUMBAR MEDIAL  BRANCH BLOCK L4-S1 bilateral #1/2 L[place] 2%;  Surgeon: Janus Von Adora Blanch, MD;  Location: [facility];  Service: Pain Services;  Laterality: Bilateral;   EPIDURAL BLOCK INJECTION Bilateral 09/29/2020   Procedure: LUMBAR MEDIAL BRANCH BLOCK L4-S1 Bilateral 1/2 2% Lidocaine ;  Surgeon: Janus Von Adora Blanch, MD;  Location: [facility];  Service: Pain Services;  Laterality: Bilateral;   EPIDURAL BLOCK INJECTION Bilateral 10/18/2020   Procedure: LUMBAR MEDIAL BRANCH BLOCK #2/2 L4-S1 BILATERAL;  Surgeon: Janus Von Adora Blanch, MD;  Location: [facility];  Service: Pain  Services;  Laterality: Bilateral;   HYSTERECTOMY      Procedure: HYSTERECTOMY   RADIOFREQUENCY ABLATION NERVES Bilateral 11/24/2020   Procedure: LUMBAR RADIOFREQUENCY L4-S1 Bilateral RFAs;  Surgeon: Janus Von Adora Blanch, MD;  Location: [facility];  Service: Pain Services;  Laterality: Bilateral;   RADIOFREQUENCY ABLATION NERVES N/A 04/12/2022   Procedure: RADIOFREQUENCY ABLATION LUMBAR FACET JOINTS Bilateral repeat L4-S1;  Surgeon: Janus Von Adora Blanch, MD;  Location: [facility];  Service: Pain Services;  Laterality: N/A;   TUBAL LIGATION     Procedure: TUBAL LIGATION  [3] Allergies Allergen Reactions   Buprenorphine Itching and Swelling   Gabapentin Swelling    Generalized Lower Extremity Edema with Weight Gain   Oxycodone Hcl-Oxycodone-Asa Hives, Itching and Swelling    Lips and tongue   Oxycodone-Aspirin Hives, Itching and Swelling    Lips and tongue   Sulfamethoxazole Anaphylaxis    Tongue, facial swelling   Sulfa (Sulfonamide Antibiotics) Itching and Rash  [4] Family History Problem Relation Name Age of Onset   Glaucoma Maternal Grandmother     Arthritis Mother     Heart failure Father     Heart disease Father     Hyperlipidemia Father     Hypertension Father     Cancer Neg Hx     Macular degeneration Neg Hx     Stroke Neg Hx      Diabetes Neg Hx    [5] Social History Tobacco Use   Smoking status: Never   Smokeless tobacco: Never  Substance Use Topics   Alcohol use: No   Drug use: No

## 2024-11-13 NOTE — ED Triage Notes (Signed)
 Pt coming from [facility] via [facility]. Per staff at facility, pt tripped and fell, hitting the back of her head. Laceration with bleeding controlled noted to back of head. Pt also has multiple bruises in various stages of healing to her face from previous fall.  Hx dementia, pt does not remember falling and [place] Per staff at facility, this is pt's baseline mental status.

## 2024-11-13 NOTE — Progress Notes (Signed)
 "   Division of Pharmacy Services  Medication History Completion Note  Name/DOB/Age of Patient: Marie Sandoval / 16-Mar-1946 / 79 y.o.  Location: [facility]  Type: Preadmission & Modality: In-Person  Confirmed two patient identifiers: Yes  Confirmed patient is alert & oriented: Yes  Medication History Source (Med History Informants):  [employer] [facility]   Asked about any missing medications (such as pumps, injectable meds, TPN, OTC, etc): Yes  PTA Med List:  Prior to Admission Medications     Reviewed by Glennis CHRISTELLA Mayotte, CPhT on 11/13/24 at 2127    Medication Sig Last Dose Informant Taking? Status  acetaminophen  (TYLENOL ) 500 mg tablet Take 1,000 mg by mouth 3 (three) times a day. 11/13/2024 12:00 PM Other Yes Active  albuterol  HFA (PROVENTIL  HFA;VENTOLIN  HFA;PROAIR  HFA) 90 mcg/actuation inhaler Inhale 2 puffs every 6 (six) hours as needed for wheezing. Unknown Other No Active         Med Note LYNWOOD, JEMIYA M   Fri Nov 13, 2024  9:22 PM) PRN  amLODIPine  (NORVASC ) 5 mg tablet Take 5 mg by mouth Once Daily. 11/13/2024  8:00 AM Other Yes Active  azelastine (OPTIVAR) 0.05 % drop ophthalmic solution Administer 1 drop into both eyes 2 (two) times a day. 11/13/2024  8:00 AM Other Yes Active  benzocain-camphor-allatoin-pet (ANBESOL COLD SORE TOP) Apply 1 Application topically 2 (two) times a day. 11/13/2024  8:00 AM Other Yes Active  BisaCODYL (DULCOLAX) 10 mg suppository Insert 10 mg into the rectum daily as needed for constipation. Unknown Other No Active         Med Note LYNWOOD, JEMIYA M   Fri Nov 13, 2024  9:18 PM) PRN  buPROPion  (WELLBUTRIN  XL) 300 mg 24 hr tablet Take 300 mg by mouth every morning.  Patient not taking: Reported on 11/13/2024   Not Taking Other No Active         Med Note LYNWOOD, JEMIYA M   Fri Nov 13, 2024  9:23 PM) Medication not documented on MAR  clonazePAM  (KlonoPIN ) 1 mg tablet Take 1 mg by mouth 3 (three) times a day. 11/13/2024 12:00 PM Other Yes  Active  clonazePAM  (KlonoPIN ) 1 mg tablet Take 1 mg by mouth daily as needed for anxiety. Unknown Other No Active         Med Note LYNWOOD, JEMIYA M   Fri Nov 13, 2024  9:19 PM) PRN  diclofenac sodium (VOLTAREN) 1 % gel Apply 2 g topically 3 (three) times a day. To right knee 11/13/2024 12:00 PM Other Yes Active  diclofenac sodium (VOLTAREN) 1 % gel Apply 2 g topically 3 (three) times a day. To right shoulder 11/13/2024 12:00 PM Other Yes Active  donepeziL  (ARICEPT ) 10 mg tablet Take 10 mg by mouth nightly.  Patient not taking: Reported on 11/13/2024   Not Taking Other No Active         Med Note LYNWOOD, JEMIYA M   Fri Nov 13, 2024  9:24 PM) Medication not documented on MAR  fluticasone propionate (FLONASE) 50 mcg/spray nasal spray Administer 2 sprays into each nostril 2 (two) times a day. 11/13/2024  8:00 AM Other Yes Active  glipiZIDE  (GLUCOTROL ) 10 mg tablet Take 10 mg by mouth Once Daily. 11/13/2024  6:00 AM Other Yes Active  hydrOXYzine (ATARAX) 50 mg tablet Take 50 mg by mouth Once Daily. Indications: itching  Patient not taking: Indications: itching. Reported on 11/13/2024   Not Taking Other No Active  Med Note LYNWOOD, GLENNIS HERO   Fri Nov 13, 2024  9:24 PM) Medication not documented on [facility]  Jardiance 25 mg tab Take 25 mg by mouth every morning. 11/13/2024  8:00 AM Other Yes Active  Lactobacillus acidophilus (Probiotic) 10 billion cell cap Take 1 capsule by mouth every morning. 11/13/2024  6:00 AM Other Yes Active  levothyroxine  (SYNTHROID ) 75 mcg tablet TAKE 1 TABLET BY MOUTH ONCE DAILY AT 6 AM  Patient taking differently: Take 75 mcg by mouth every morning.   11/13/2024  6:00 AM Other Yes Active  linaGLIPtin  (Tradjenta ) 5 mg tab Take 5 mg by mouth Once Daily. 11/13/2024  8:00 AM Other Yes Active  lisinopriL  (PRINIVIL ) 5 mg tablet Take 5 mg by mouth daily. 11/13/2024  8:00 AM Other Yes Active  loperamide (IMODIUM) 2 mg capsule Take 2 mg by mouth every 6 (six) hours as needed for diarrhea.  Unknown Other No Active         Med Note LYNWOOD, JEMIYA M   Fri Nov 13, 2024  9:19 PM) PRN  LORazepam  (ATIVAN ) 0.5 mg tablet Take 1 mg by mouth every 12 (twelve) hours as needed for anxiety.  Patient not taking: Reported on 11/13/2024   Not Taking Other No Active         Med Note LYNWOOD, JEMIYA M   Fri Nov 13, 2024  9:24 PM) Medication not documented on MAR  magnesium hydroxide (MILK OF MAGNESIA) 400 mg/5 mL suspension Take 30 mL by mouth daily as needed for constipation. Unknown Other No Active         Med Note LYNWOOD, JEMIYA M   Fri Nov 13, 2024  9:20 PM) PRN  melatonin 5 mg tablet Take 5 mg by mouth at bedtime. 11/12/2024  8:00 PM Other Yes Active  metoprolol tartrate (LOPRESSOR) 25 mg tablet Take 25 mg by mouth 2 (two) times a day.  Patient not taking: Reported on 11/13/2024   Not Taking Other No Active         Med Note LYNWOOD, JEMIYA M   Fri Nov 13, 2024  9:25 PM) Medication not documented on MAR  montelukast  (SINGULAIR ) 10 mg tablet Take 1 tablet by mouth nightly 11/12/2024  8:00 PM Other Yes Active  nystatin (MYCOSTATIN) 100,000 unit/gram ointment APPLY LIBERALLY TOPICALLY TO AFFECTED AREA FOUR TO FIVE TIMES DAILY  Patient not taking: Reported on 11/13/2024   Not Taking Other No Active         Med Note LYNWOOD, JEMIYA M   Fri Nov 13, 2024  9:25 PM) Medication not documented on MAR  omeprazole (PriLOSEC) 40 mg DR capsule Take 40 mg by mouth 2 (two) times a day. 11/13/2024  8:00 AM Other Yes Active  polyethylene glycol (MIRALAX ) 17 gram powd powder Take 17 g by mouth daily as needed for constipation. Unknown Other No Active         Med Note LYNWOOD, JEMIYA M   Fri Nov 13, 2024  9:21 PM) PRN  QUEtiapine  (SEROquel ) 100 mg tablet Take 100 mg by mouth 2 (two) times a day. 11/13/2024  8:00 AM Other Yes Active  sertraline  (ZOLOFT ) 100 mg tablet Take 100 mg by mouth daily.  Patient not taking: Reported on 11/13/2024   Not Taking Other No Active         Med Note LYNWOOD, JEMIYA M   Fri Nov 13, 2024   9:26 PM) Medication not documented on MAR  sodium phosphates (FLEET ADULT) 19-7 gram/118 mL enema Insert 133  mL into the rectum daily as needed. Unknown Other No Active         Med Note LYNWOOD, JEMIYA M   Fri Nov 13, 2024  9:19 PM) PRN  temazepam  (RESTORIL ) 15 mg capsule Take 15 mg by mouth at bedtime. 11/12/2024  8:00 PM Other Yes Active  traMADoL (ULTRAM) 50 mg tablet TAKE 2 TABLETS BY MOUTH TWICE DAILY AS NEEDED FOR MODERATE PAIN  (4-6)  Patient taking differently: Take 50 mg by mouth 3 (three) times a day.   11/13/2024  1:00 PM Other Yes Active  traZODone  (DESYREL ) 100 mg tablet Take 100 mg by mouth nightly. 11/11/2024  8:00 PM Other No Active  Med List Note Trenda CHRISTELLA Mayotte, CPhT 11/13/24 2027): [facility]           Audit from [facility]   **Prior to Admission medications have not yet been reviewed for this encounter**     Selected Pharmacy:  [facility] - [place], [place] - [address] - PHONE: [phone] - FAX: [phone]  Comments: Medications verified with [facility] from [facility]. DPS declined. Electronically signed by: Jemiya M Hardy, CPhT 11/13/2024 9:26 PM  Medications reconciled by provider: No   Signature/Co-signature, if required: Jemiya M Hardy, CPhT   Date/Time: 11/13/2024 9:27 PM  "

## 2024-11-13 NOTE — H&P (Signed)
 " HOSPITAL MEDICINE - ADMISSION NOTE  Primary Care Physician Provider Not In System  Primary Contact Extended Emergency Contact Information Primary Emergency Contact: Kern,Rebecca Home Phone: [phone] Mobile Phone: [phone] Relation: Daughter   Chief Complaint Fall  History of Present Illness   Marie Sandoval is a 79 y.o. female with history of hypertension diabetes hypothyroidism dementia resident of skilled nursing facility who presented to the hospital due to falls.  Patient reportedly had falls over the past 2 to 3 days provide ED visits on 11/07/2024, 11/08/2024. Patient Is demented at baseline. Tangential speech.   As per documentation on January 10 had a facial laceration and needed stitches, on January 11 had a orbital fracture received stitches to the face.  In the ED Vitals within normal limits Initial head CT negative for any acute findings Was found to have scalp laceration. S/p staples in the ED   Review of Systems Unable to obtain  PMFSH and Home Medications   Past Medical History  Medical History[1]  Surgical History Surgical History[2]   Family History Family History[3]  Social History Social History[4]   Allergies Buprenorphine, Gabapentin, Oxycodone hcl-oxycodone-asa, Oxycodone-aspirin, Sulfamethoxazole, and Sulfa (sulfonamide antibiotics)  Medications Prescriptions Prior to Admission[5]   Objective   Temp:  [98.2 F (36.8 C)] 98.2 F (36.8 C) Heart Rate:  [74] 74 Resp:  [18] 18 BP: (125)/(59) 125/59    Physical Exam Constitutional:      Appearance: She is normal weight.  HENT:     Head: Normocephalic.     Nose: Nose normal.     Mouth/Throat:     Mouth: Mucous membranes are moist.     Pharynx: Oropharynx is clear.  Eyes:     Extraocular Movements: Extraocular movements intact.     Conjunctiva/sclera: Conjunctivae normal.     Pupils: Pupils are equal, round, and reactive to light.  Cardiovascular:     Rate and Rhythm:  Normal rate and regular rhythm.     Pulses: Normal pulses.     Heart sounds: Normal heart sounds.  Pulmonary:     Effort: Pulmonary effort is normal.     Breath sounds: Normal breath sounds.  Abdominal:     General: Abdomen is flat. Bowel sounds are normal.     Palpations: Abdomen is soft.  Musculoskeletal:        General: Normal range of motion.     Cervical back: Normal range of motion and neck supple.  Skin:    General: Skin is warm.     Capillary Refill: Capillary refill takes less than 2 seconds.  Neurological:     Mental Status: She is alert. She is disoriented.                 No results found for this visit on 11/13/24 (from the past week).   CT Head WO Contrast W Quant CT Tiss Character When Performed Result Date: 11/13/2024 1.  No acute intracranial abnormality. 2.  Right posterior parietal scalp laceration with skin staples present.   Assessment and Plan   Assessment & Plan Scalp laceration, initial encounter - Status post staples in the ED -Continue topical treatments -Monitor patient's symptoms Schizoaffective disorder, bipolar type    (CMD) - Continue home meds Stage 3b chronic kidney disease (CMD) - Continue to monitor Gastroesophageal reflux disease without esophagitis - Continue home meds Acquired hypothyroidism - Obtain TSH.  Continue home meds Fibromyalgia - Continue home meds. Type 2 diabetes mellitus with stage 3b chronic kidney disease, without long-term  current use of insulin     (CMD) - Start sliding scale insulin  HSQ ACHS Chronic obstructive pulmonary disease (CMD) - Continue inhaler therapy. Frequent falls - Order PT OT - Obtain TSH B12 vitamin D RPR -Could be polypharmacy? -Reportedly on Ativan  and Klonopin ?.  Will hold medications as per EMR to avoid fall risk.  - Initial head CT negative for any acute findings  DVT Prophylaxis: Heparin SubQ  CODE STATUS: DNAR. Information obtained from patient  Could [facility] be  considered for Hospital at Home in the next 24 hours? No   Anticipate < 2 midnight stay, patient will be admitted for observation   Zoaib Safdar Rasool, DO        [1] Past Medical History: Diagnosis Date   Acid reflux    Acquired hypothyroidism    Acute blood loss anemia    Allergic rhinitis    Arthritis, multiple joint involvement    At high risk for falls per Deloris fall risk assessment scale    Benign hypertension with CKD (chronic kidney disease) stage IV    (CMD)    Bilateral dry eyes    Bipolar 1 disorder    (CMD)    Cataract    Chronic bilateral back pain    CKD (chronic kidney disease) stage 4, GFR 15-29 ml/min    (CMD)    Degenerative disc disease, lumbar    Dementia (CMD)    Diuretic-induced hypokalemia    Elevated serum GGT level    Fibromyalgia    Hammer toe of left foot    Hammer toe of right foot    Keratoconjunctivitis sicca of both eyes not specified as Sjogren's    Mild intermittent asthma without complication (CMD)    Multiple fractures of ribs of right side    Polyneuropathy due to secondary diabetes mellitus    (CMD)    Presbyopia OU    Primary osteoarthritis of first carpometacarpal joint of left hand    Primary osteoarthritis of first carpometacarpal joint of right hand    Proximal muscle weakness    Schizoaffective disorder, bipolar type    (CMD)    Spondylosis of lumbar spine    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   Sternal fracture    Type 2 diabetes mellitus with stage 3b chronic kidney disease, without long-term current use of insulin     (CMD) 01/05/2020   Type 2 diabetes mellitus with stage 4 chronic kidney disease, without long-term current use of insulin     (CMD)    Unsteady gait   [2] Past Surgical History: Procedure Laterality Date   CHOLECYSTECTOMY     Procedure: CHOLECYSTECTOMY   EPIDURAL BLOCK INJECTION Bilateral 09/20/2020   Procedure: LUMBAR MEDIAL BRANCH BLOCK L4-S1 bilateral  #1/2 Lido 2%;  Surgeon: Janus Von Adora Blanch, MD;  Location: [facility];  Service: Pain Services;  Laterality: Bilateral;   EPIDURAL BLOCK INJECTION Bilateral 09/29/2020   Procedure: LUMBAR MEDIAL BRANCH BLOCK L4-S1 Bilateral 1/2 2% Lidocaine ;  Surgeon: Janus Von Adora Blanch, MD;  Location: [facility];  Service: Pain Services;  Laterality: Bilateral;   EPIDURAL BLOCK INJECTION Bilateral 10/18/2020   Procedure: LUMBAR MEDIAL BRANCH BLOCK #2/2 L4-S1 BILATERAL;  Surgeon: Janus Von Adora Blanch, MD;  Location: [facility];  Service: Pain Services;  Laterality: Bilateral;   HYSTERECTOMY      Procedure: HYSTERECTOMY   RADIOFREQUENCY ABLATION NERVES Bilateral 11/24/2020   Procedure: LUMBAR RADIOFREQUENCY L4-S1 Bilateral RFAs;  Surgeon: Janus Von Adora Blanch, MD;  Location: [facility];  Service: Pain Services;  Laterality: Bilateral;   RADIOFREQUENCY ABLATION NERVES N/A 04/12/2022   Procedure: RADIOFREQUENCY ABLATION LUMBAR FACET JOINTS Bilateral repeat L4-S1;  Surgeon: Janus Von Adora Blanch, MD;  Location: [facility];  Service: Pain Services;  Laterality: N/A;   TUBAL LIGATION     Procedure: TUBAL LIGATION  [3] Family History Problem Relation Name Age of Onset   Glaucoma Maternal Grandmother     Arthritis Mother     Heart failure Father     Heart disease Father     Hyperlipidemia Father     Hypertension Father     Cancer Neg Hx     Macular degeneration Neg Hx     Stroke Neg Hx     Diabetes Neg Hx    [4] Social History Socioeconomic History   Marital status: Divorced  Tobacco Use   Smoking status: Never   Smokeless tobacco: Never  Substance and Sexual Activity   Alcohol use: No   Drug use: No   Social Drivers of Health   Food Insecurity: Unknown (03/21/2024)   Food vital sign    Within the past 12 months, you worried that your food would run out before you got money to buy more: Patient declined to  answer    Within the past 12 months, the food you bought just didn't last and you didn't have money to get more: Patient declined to answer  Utilities: Unknown (03/21/2024)   Utilities    In the past 12 months has the electric, gas, oil, or water  company threatened to shut off services in your home? : Patient declined to answer  Safety: Unknown (02/02/2022)   Received from [facility]   HITS    Physically Hurt: Not on file    Insult or Talk Down To: Not on file    Threaten Physical Harm: Not on file    Scream or Curse: Not on file  Alcohol Screening: Not At Risk (11/08/2024)   Alcohol    Audit C Alcohol risk score: 0  Tobacco Use: Low Risk (06/02/2024)   Received from [facility]   Patient History    Smoking Tobacco Use: Never    Smokeless Tobacco Use: Never  Depression: Not at risk (11/15/2022)   Received from [facility] visits prior to 12/29/2022.   PHQ-2    SDOH PHQ2 SCORE: 0  Social Connections: Unknown (03/13/2022)   Received from [facility]   Social Network    Social Network: Not on file  [5] (Not in a hospital admission)  "

## 2024-11-14 NOTE — Progress Notes (Signed)
 Case Management Update  Date: 11/14/2024   Time: 9:35 AM   Patient Type: Observation  Patient admitted for:  Scalp laceration, initial encounter  Plan:  -monitoring VS, labs -medication adjustments -pain management -PT, OT evals pending  Per Levon at [employer], [phone]) this patient is a LTC resident and can return at D/C. Does not need INS auth to return     Case Management Coordination Status: Coordination In-Progress    Anticipated Discharge Location: Nursing Facility Long Term Care       Post Acute Placement Status: Review currently in progress.  Suzen MARLA Null, RN  BSN, Care Manager, Office #: [phone], Mobile #: [phone]

## 2024-11-14 NOTE — Discharge Summary (Signed)
 [facility] Hospitalist Discharge Summary  Identifying Information:  Zissy Hamlett [facility] Jul 21, 1946 [id number]  Admit date: 11/13/2024  Discharge date: 11/14/2024   Discharge Service: [facility] Hospitalist  Discharge Attending Physician:Basavatti Madappa Sowmya*  Discharge to: snf   Discharge Diagnoses:    Scalp laceration, initial encounter   Schizoaffective disorder, bipolar type    (CMD)   Stage 3b chronic kidney disease (CMD)   Gastroesophageal reflux disease without esophagitis   Acquired hypothyroidism   Fibromyalgia   Type 2 diabetes mellitus with stage 3b chronic kidney disease, without long-term current use of insulin     (CMD)   Chronic obstructive pulmonary disease (CMD)   Frequent falls Polypharmacy  Hospital Course:  Sentoria A. Rossa is a 79 year old female with a history of hypertension, type 2 diabetes mellitus, hypothyroidism, dementia, schizoaffective disorder, and chronic obstructive pulmonary disease who was admitted on 11/13/2024 following multiple falls resulting in a scalp laceration. She was noted to have had recent ED visits for facial trauma, including a laceration requiring sutures and an orbital fracture, prior to this admission. On presentation, her vital signs were stable and initial head CT demonstrated no acute intracranial abnormality but did reveal a right posterior parietal scalp laceration with skin staples placed in the ED.   Her scalp laceration was managed with staples and topical treatments, and her symptoms were monitored during the hospitalization. Physical and occupational therapy evaluations were obtained, which identified significant functional decline from baseline, including deficits in safety awareness, mobility, balance, and activity tolerance, necessitating maximal assistance for transfers and ambulation and recommending post-acute therapy at discharge to address these deficits.   During admission, her medication regimen was reviewed and adjustments were  made to minimize fall risk, including discontining  lorazepam  and reducing clonazepam  dose to as needed . Additional chronic comorbidities, including stage 3b chronic kidney disease, gastroesophageal reflux disease, fibromyalgia, and chronic pain, were monitored without acute changes. She remained baseline cognitively impaired and required continuous assistance with activities of daily living. Discharge planning was coordinated with her skilled nursing facility, and she was anticipated to return to long-term care upon discharge.  On 11/14/2024 patient was at baseline her orthostatics were negative and she is medically stable and can return to her facility to continue rehab at skilled nursing facility.  I have discussed discharge plan in detail with patient's daughter Ms. Asberry Alu [phone] and discussed plan in detail.  Patient is recommended to closely follow with her primary care physician at skilled nursing facility for further adjustment of her medications _____________________________ Discharge Day Services: BP (!) 118/52 (BP Location: Right arm, Patient Position: Standing)   Pulse 70   Temp 98.4 F (36.9 C) (Oral)   Resp 18   Ht 1.676 m (5' 6)   Wt 61.2 kg (134 lb 14.7 oz)   SpO2 99%   BMI 21.78 kg/m  Pt seen on the day of discharge and determined appropriate for discharge. GEN: NAD, lying in bed EYES: EOMI ENT: MMM CV: RRR PULM: CTA B ABD: soft, NT/ND, +BS EXT: No edema On examination of the scalp Right posterior parietal scalp laceration with skin staples present.  Patient is moving all extremities no focal neurological deficit  Condition at Discharge: stable  Length of Discharge: I spent 40 mins in the discharge of this patient. _____________________________________________________________________________ Discharge Medications:   Medication List     PAUSE taking these medications    * clonazePAM  1 mg tablet Wait to take this until your doctor or other care  provider tells you to  start again. Commonly known as: KlonoPIN  Take 1 mg by mouth 3 (three) times a day. You also have another medication with the same name that you may need to continue taking.   temazepam  15 mg capsule Wait to take this until your doctor or other care provider tells you to start again. Commonly known as: RESTORIL  Take 15 mg by mouth at bedtime.      * * This list has 1 medication(s) that are the same as other medications prescribed for you. Read the directions carefully, and ask your doctor or other care provider to review them with you.          START taking these medications    buPROPion  300 mg 24 hr tablet Commonly known as: WELLBUTRIN  XL Take 300 mg by mouth every morning.   donepeziL  10 mg tablet Commonly known as: ARICEPT  Take 10 mg by mouth nightly.   metoprolol tartrate 25 mg tablet Commonly known as: LOPRESSOR Take 25 mg by mouth 2 (two) times a day.       CHANGE how you take these medications    * acetaminophen  325 mg tablet Commonly known as: TYLENOL  Take 2 tablets (650 mg total) by mouth every 6 (six) hours as needed (Fever GREATER THAN 100.4 F(38 C)). What changed: You were already taking a medication with the same name, and this prescription was added. Make sure you understand how and when to take each.   * acetaminophen  500 mg tablet Commonly known as: TYLENOL  Take 1,000 mg by mouth 3 (three) times a day. What changed: Another medication with the same name was added. Make sure you understand how and when to take each.   * clonazePAM  1 mg tablet Commonly known as: KlonoPIN  Take 1 tablet (1 mg total) by mouth daily as needed for anxiety. What changed: Another medication with the same name was paused. Ask your nurse or doctor if you should take this medication.   hydrOXYzine 25 mg tablet Commonly known as: ATARAX Take 1 tablet (25 mg total) by mouth every 6 (six) hours as needed for anxiety Indications: itching. What changed:   medication strength how much to take when to take this reasons to take this   levothyroxine  75 mcg tablet Commonly known as: SYNTHROID  TAKE 1 TABLET BY MOUTH ONCE DAILY AT 6 AM What changed: See the new instructions.   * QUEtiapine  100 mg tablet Commonly known as: SEROquel  Take 1 tablet (100 mg total) by mouth daily. MORNING What changed:  when to take this additional instructions   * QUEtiapine  50 mg tablet Commonly known as: SEROquel  Take 1 tablet (50 mg total) by mouth nightly. What changed: Another medication with the same name was changed. Make sure you understand how and when to take each.   sertraline  25 mg tablet Commonly known as: ZOLOFT  Take 4 tablets (100 mg total) by mouth daily. What changed: medication strength      * * This list has 5 medication(s) that are the same as other medications prescribed for you. Read the directions carefully, and ask your doctor or other care provider to review them with you.          CONTINUE taking these medications    albuterol  HFA 90 mcg/actuation inhaler Commonly known as: PROVENTIL  HFA;VENTOLIN  HFA;PROAIR  HFA Inhale 2 puffs every 6 (six) hours as needed for wheezing.   amLODIPine  5 mg tablet Commonly known as: NORVASC  Take 5 mg by mouth Once Daily.   ANBESOL COLD SORE TOP Apply 1  Application topically 2 (two) times a day.   azelastine 0.05 % Drop ophthalmic solution Commonly known as: OPTIVAR Administer 1 drop into both eyes 2 (two) times a day.   BisaCODYL 10 mg suppository Commonly known as: DULCOLAX Insert 10 mg into the rectum daily as needed for constipation.   * diclofenac sodium 1 % gel Commonly known as: VOLTAREN Apply 2 g topically 3 (three) times a day. To right shoulder   * diclofenac sodium 1 % gel Commonly known as: VOLTAREN Apply 2 g topically 3 (three) times a day. To right knee   fluticasone propionate 50 mcg/spray nasal spray Commonly known as: FLONASE Administer 2 sprays into each  nostril 2 (two) times a day.   glipiZIDE  10 mg tablet Commonly known as: GLUCOTROL  Take 10 mg by mouth Once Daily.   Jardiance 25 mg Tab Generic drug: empagliflozin Take 25 mg by mouth every morning.   lisinopriL  5 mg tablet Commonly known as: PRINIVIL  Take 5 mg by mouth daily.   loperamide 2 mg capsule Commonly known as: IMODIUM Take 2 mg by mouth every 6 (six) hours as needed for diarrhea.   magnesium hydroxide 400 mg/5 mL suspension Commonly known as: MILK OF MAGNESIA Take 30 mL by mouth daily as needed for constipation.   melatonin 5 mg tablet Take 5 mg by mouth at bedtime.   montelukast  10 mg tablet Commonly known as: SINGULAIR  Take 1 tablet by mouth nightly   omeprazole 40 mg DR capsule Commonly known as: PriLOSEC Take 40 mg by mouth 2 (two) times a day.   polyethylene glycol 17 gram Powd powder Commonly known as: MIRALAX  Take 17 g by mouth daily as needed for constipation.   Probiotic 10 billion cell Cap Generic drug: Lactobacillus acidophilus Take 1 capsule by mouth every morning.   sodium phosphates 19-7 gram/118 mL enema Commonly known as: FLEET ADULT Insert 133 mL into the rectum daily as needed.   Tradjenta  5 mg Tab Generic drug: linaGLIPtin  Take 5 mg by mouth Once Daily.   traZODone  100 mg tablet Commonly known as: DESYREL  Take 100 mg by mouth nightly.      * * This list has 2 medication(s) that are the same as other medications prescribed for you. Read the directions carefully, and ask your doctor or other care provider to review them with you.          STOP taking these medications    LORazepam  0.5 mg tablet Commonly known as: ATIVAN    nystatin 100,000 unit/gram ointment Commonly known as: MYCOSTATIN   traMADoL 50 mg tablet Commonly known as: ULTRAM         Where to Get Your Medications     These medications were sent to [facility] [place], [place] - [address] - PHONE: [phone] - FAX:  [phone]  [address] [place] [address]    Phone: [phone]  acetaminophen  325 mg tablet hydrOXYzine 25 mg tablet    You can get these medications from any pharmacy   Bring a paper prescription for each of these medications clonazePAM  1 mg tablet    Information about where to get these medications is not yet available   Ask your nurse or doctor about these medications QUEtiapine  100 mg tablet QUEtiapine  50 mg tablet sertraline  25 mg tablet    _____________________________________________________________ Pending Test Results (if blank, then none): @PRINTGROUPPENDLAB @  Most Recent Labs:  Lab Results  Component Value Date/Time   WBC 3.91 (L) 11/13/2024 1855  RBC 3.56 (L) 11/13/2024 1855   HGB 11.9 (L) 11/13/2024 1855   HCT 35.4 (L) 11/13/2024 1855   PLT 160 11/13/2024 1855    Lab Results  Component Value Date   WBC 3.91 (L) 11/13/2024   HGB 11.9 (L) 11/13/2024   HCT 35.4 (L) 11/13/2024   PLT 160 11/13/2024    Lab Results  Component Value Date   CO2 22 11/13/2024   BUN 14 11/13/2024   GLUCOSE 95 11/13/2024   CREATININE 1.10 11/13/2024   CALCIUM 8.9 11/13/2024   ALBUMIN 3.5 09/24/2024   AST 23 09/24/2024   ALT 8 09/24/2024    Lab Results  Component Value Date   NA 142 11/13/2024   K 3.7 11/13/2024   CL 111 (H) 11/13/2024   CO2 22 11/13/2024   BUN 14 11/13/2024   CREATININE 1.10 11/13/2024   CALCIUM 8.9 11/13/2024   PHOS 3.8 02/24/2021    Lab Results  Component Value Date   BILITOT 0.6 09/24/2024   PROT 5.7 (L) 09/24/2024   ALBUMIN 3.5 09/24/2024   ALT 8 09/24/2024   AST 23 09/24/2024    No results found for: PT, INR, APTT Hospital Radiology:  CT Facial Bones WO Contrast Result Date: 11/13/2024 Date of Service: 2024-11-13 17:00:00 EXAM: CT Maxillofacial Without Intravenous Contrast. CLINICAL HISTORY: Fall. TECHNIQUE: Axial computed tomography images of the face without intravenous contrast. Sagittal and coronal  reformations performed. CONTRAST: Without COMPARISON: None provided. FINDINGS: BONES: No acute fracture or focal osseous lesion. The mandible is intact. SOFT TISSUES: Mild superficial soft tissue swelling in the right infraorbital region. SINUSES: The sinuses are clear. ORBITS: The orbits are normal. No retrobulbar hematoma or mass. MISCELLANEOUS: Study limited due to motion.   1. Study limited due to motion. 2. No acute maxillofacial fracture demonstrated to the extent visualized. Consider repeat exam when the patient is more amenable if there is high clinical suspicion of underlying injury. Electronically signed by: Lynwood Daniels, MD on 11/13/2024 09:35:00 PM US [facility]ette Per PQRS, all CT exams are performed using one or more of the following dose reduction techniques: automated exposure control, adjustment of the mA and/or kV according to patient size, or use of iterative reconstruction technique.   CT Spine Cervical WO Contrast Result Date: 11/13/2024 Date of Service: 2024-11-13 17:01:00 EXAM: CT Cervical Spine Without Intravenous Contrast. CLINICAL HISTORY: Fall. TECHNIQUE: Axial computed tomography images of the cervical spine without intravenous contrast. Sagittal and coronal reformations performed. COMPARISON: None provided. FINDINGS: BONES: No acute fracture or focal osseous lesion to the extent visualized. Bony alignment is anatomic. DISCS / DEGENERATIVE CHANGES: Multilevel disc space narrowing, spur formation and hypertrophic facet disease noted. Multilevel neural foraminal narrowing. SOFT TISSUES: No prevertebral soft tissue swelling. No apical pneumothorax. MISCELLANEOUS: Study limited due to motion.   Study limited due to motion. No acute fracture or malalignment demonstrated to the extent visualized.  Consider repeat exam when the patient is more amenable if there is high clinical suspicion of underlying injury. Electronically signed by: Lynwood Daniels, MD on 11/13/2024 09:16:17 PM US [facility]ette Per PQRS, all  CT exams are performed using one or more of the following dose reduction techniques: automated exposure control, adjustment of the mA and/or kV according to patient size, or use of iterative reconstruction technique.   XR Pelvis 1-2 Views Result Date: 11/13/2024 Date of Service: 2024-11-13 17:19:00 EXAM: XR Pelvis, 1  View. CLINICAL HISTORY: fall. COMPARISON: None provided.  FINDINGS: BONES: No acute fracture or focal osseous lesion. Mild diffuse osteopenia noted. JOINTS: No  dislocation. Moderate degenerative joint disease is noted. SOFT TISSUES: The soft tissues are unremarkable.   No acute osseous abnormality. Electronically signed by: Juluis Bound, MD on 11/13/2024 06:47:16 PM US [facility]ette  XR Chest 2 Views Result Date: 11/13/2024 Date of Service: 2024-11-13 17:18:00 EXAM: XR Chest, 2  View. CLINICAL HISTORY: fall. COMPARISON: None provided.  FINDINGS: LUNGS AND PLEURA: Bilateral diffuse interstitial markings noted. Bibasilar atelectatic changes are seen.  No focal infiltrates. No pleural effusion or pneumothorax. HEART AND MEDIASTINUM: The heart size and mediastinal contours are normal. BONES: No acute osseous abnormality.   Chronic lung findings without focal infiltrates. Electronically signed by: Juluis Bound, MD on 11/13/2024 06:45:40 PM US Robinette  CT Head WO Contrast W Quant CT Tiss Character When Performed Result Date: 11/13/2024 CT HEAD WITHOUT CONTRAST, 11/13/2024 4:33 PM INDICATION: fall COMPARISON: CT head 11/08/2024 TECHNIQUE: Axial CT images of the brain from skull base to vertex, including portions of the face and sinuses, were obtained without contrast. Supplemental 2D reformatted images were generated and reviewed as needed. All CT scans at [facility] and [facility] are performed using radiation dose optimization techniques as appropriate to a performed exam, including but not limited to one or more of the following: automatic exposure  control, adjustment of the mA and/or kV according to patient size, use of iterative reconstruction technique. In addition, our institution participates in a radiation dose monitoring program to optimize patient radiation exposure. FINDINGS: Calvarium/skull base: No evidence of acute fracture or destructive lesion. Mastoids and middle ears demonstrate no substantial mucosal disease. Laceration along the right posterior parietal scalp with skin staples in place. Paranasal sinuses: No air fluid levels. Brain: No acute large vascular territory infarct. No mass effect. No hydrocephalus. No acute hemorrhage. Periventricular white matter hypoattenuations, likely reflective of chronic microvascular ischemic changes. Global cerebral volume loss with ex-vacuo dilatation of the lateral ventricles.   1.  No acute intracranial abnormality. 2.  Right posterior parietal scalp laceration with skin staples present.  CT Spine Cervical WO Contrast Result Date: 11/08/2024 Date of Service: 2024-11-08 10:36:00 EXAM: CT Cervical Spine Without Intravenous Contrast. CLINICAL HISTORY: Fall unwitnessed. TECHNIQUE: Axial computed tomography images of the cervical spine without intravenous contrast. Sagittal and coronal reformations performed. COMPARISON: None provided.  FINDINGS: BONES: No acute fracture or focal osseous lesion. Mild-to-moderate dextroscoliosis of the mid and lower cervical spine.  There is minimal anterior subluxation C3 on C4 DISCS / DEGENERATIVE CHANGES: There is moderate degenerative change throughout the cervical region with narrowing at the C5-6 interspace. No significant central canal or neural foraminal stenosis. SOFT TISSUES: No prevertebral soft tissue swelling. No apical pneumothorax.   No acute cervical spine fracture or dislocation. Electronically signed by: Marinda Silversmith, MD on 11/08/2024 11:41:00 AM US [facility]ette Per PQRS, all CT exams are performed using one or more of the following dose reduction  techniques: automated exposure control, adjustment of the mA and/or kV according to patient size, or use of iterative reconstruction technique.   CT Head WO Contrast W Quant CT Tiss Character When Performed Result Date: 11/08/2024 Date of Service: 2024-11-08 10:36:00 EXAM: CT Head with and without Intravenous Contrast. CLINICAL HISTORY: Fall unwitnessed. TECHNIQUE: Axial computed tomography images of the head/brain with and without intravenous contrast. CONTRAST: Contrast was administered.  COMPARISON: 11/25/2023 FINDINGS: BRAIN: No acute intraparenchymal hemorrhage. No mass lesion. No abnormal enhancement. No CT evidence for acute territorial infarct. No midline shift or extra-axial collection.  Periventricular leukomalacia is identified consistent with subcortical arteriosclerotic disease in the  patient's age. VENTRICLES: No hydrocephalus. ORBITS: The orbits are unremarkable. SINUSES AND MASTOIDS: The paranasal sinuses and mastoid air cells are clear. SOFT TISSUES: No significant facial or scalp soft tissue swelling evident. No radiopaque foreign body is seen. BONES: No acute skull fracture.   No acute intracranial abnormality. Stable chronic vascular changes Electronically signed by: Marinda Silversmith, MD on 11/08/2024 11:35:18 AM [facility] Per PQRS, all CT exams are performed using one or more of the following dose reduction techniques: automated exposure control, adjustment of the mA and/or kV according to patient size, or use of iterative reconstruction technique.   CT Facial Bones WO Contrast Result Date: 11/07/2024 EXAM: CT OF THE FACE WITHOUT CONTRAST 11/07/2024 12:59:13 PM TECHNIQUE: CT of the face was performed without the administration of intravenous contrast. Multiplanar reformatted images are provided for review. Automated exposure control, iterative reconstruction, and/or weight based adjustment of the mA/kV was utilized to reduce the radiation dose to as low as reasonably achievable.  COMPARISON: CT maxillofacial 09/01/2023. CLINICAL HISTORY: Facial trauma, blunt. FINDINGS: FACIAL BONES: Acute right orbital floor fracture with 1 to 2 mm of depression. Absent maxillary dentition. Multiple caries involving remaining mandibular teeth. No mandibular dislocation. No suspicious bone lesion. ORBITS: Globes are intact. Right periorbital soft tissue swelling with a small hematoma inferior to the orbit and a laceration superior to the orbit. No herniation of orbital contents or retrobulbar hematoma associated with the above-described right orbital floor fracture. SINUSES AND MASTOIDS: Mild scattered mucosal thickening in the paranasal sinuses. Small fluid level in the right maxillary sinus. Clear mastoid air cells and middle ear cavities. IMPRESSION: 1. Acute, minimally displaced right orbital floor fracture. 2. Right periorbital soft tissue swelling, hematoma, and laceration. Electronically signed by: Dasie Hamburg MD MD 11/07/2024 02:07 PM EST RP Workstation: [id number]  CT Head WO Contrast W Quant CT Tiss Character When Performed Result Date: 11/07/2024 EXAM: CT HEAD WITHOUT CONTRAST 11/07/2024 12:59:13 PM TECHNIQUE: CT of the head was performed without the administration of intravenous contrast. Automated exposure control, iterative reconstruction, and/or weight based adjustment of the mA/kV was utilized to reduce the radiation dose to as low as reasonably achievable. COMPARISON: Head CT 08/29/2024 and MRI 10/05/2022. CLINICAL HISTORY: Head trauma, minor (Age >= 65y). FINDINGS: BRAIN AND VENTRICLES: There is no evidence of an acute infarct, intracranial hemorrhage, mass, midline shift, hydrocephalus, or extra-axial fluid collection. There is mild cerebral atrophy. Cerebral white matter hypodensities are unchanged and nonspecific but compatible with mild chronic small vessel ischemic disease. Calcified atherosclerosis at the skull base. ORBITS: Reported on today's separate maxillofacial CT. SINUSES:  Reported on today's separate maxillofacial CT. SOFT TISSUES AND SKULL: Right periorbital hematoma and laceration. No skull fracture. IMPRESSION: 1. No acute intracranial abnormality. Electronically signed by: Dasie Hamburg MD MD 11/07/2024 01:58 PM EST RP Workstation: [id number]  CT Spine Cervical WO Contrast Result Date: 11/07/2024 EXAM: CT CERVICAL SPINE WITHOUT CONTRAST 11/07/2024 12:59:13 PM TECHNIQUE: CT of the cervical spine was performed without the administration of intravenous contrast. Multiplanar reformatted images are provided for review. Automated exposure control, iterative reconstruction, and/or weight based adjustment of the mA/kV was utilized to reduce the radiation dose to as low as reasonably achievable. COMPARISON: CT cervical spine 08/29/2024. CLINICAL HISTORY: Neck trauma. FINDINGS: BONES AND ALIGNMENT: Mild right convex curvature of the cervical spine. Unchanged trace anterolisthesis of C3 on C4, C4 on C5, and C7 on T1. No acute fracture or suspicious lesion. Unchanged chronic anterior wedging of the C7 vertebral body. DEGENERATIVE CHANGES: Advanced median and left lateral  C1-C2 arthropathy. Partial interbody and facet ankylosis at C2-C3, C3-C4, and C5-C6. Widespread advanced cervical facet arthrosis. No evidence of high grade spinal canal stenosis. Asymmetrically advanced neural foraminal stenosis on the left at C3-C4 and on the right at C4-C5. SOFT TISSUES: No prevertebral soft tissue swelling. IMPRESSION: 1. No acute cervical spine fracture or traumatic malalignment. Electronically signed by: Dasie Hamburg MD MD 11/07/2024 01:55 PM EST RP Workstation: [id number]  XR Hand Minimum 3 Views Right Result Date: 11/07/2024 CLINICAL DATA:  Status post fall. EXAM: RIGHT HAND - COMPLETE 3+ VIEW COMPARISON:  None Available. FINDINGS: There is no evidence of acute fracture or dislocation. A radiopaque ring is seen overlying the proximal phalanx of the fifth right finger which is subsequently limited  in evaluation. A chronic fracture deformity is seen involving the mid and distal portions of the fifth right metacarpal. Chronic and marked severity degenerative changes are present involving the first carpometacarpal joint. Soft tissues are unremarkable. IMPRESSION: Chronic and degenerative changes without evidence of acute fracture or dislocation. Electronically Signed   By: Suzen Dials M.D.   On: 11/07/2024 12:37  XR Shoulder Minimum 2 Views Right Result Date: 11/07/2024 CLINICAL DATA:  Status post fall. EXAM: RIGHT SHOULDER - 2+ VIEW COMPARISON:  None Available. FINDINGS: There is no evidence of an acute fracture or dislocation. Chronic right rib fractures are noted. There is no evidence of arthropathy or other focal bone abnormality. Soft tissues are unremarkable. IMPRESSION: 1. No acute fracture or dislocation. 2. Chronic right rib fractures. Electronically Signed   By: Suzen Dials M.D.   On: 11/07/2024 12:35   _____________________________________________________________________________ Discharge Instructions:   Discharge Orders     DNAR per Portable DNAR     Discharge Diet (specify)     Details:    Diet type: Consistent Carbohydrate   Carbohydrate Restriction: Med (45-60 gm/meal)   Discharge instructions - Other (specify)     Comments: F/U PCP IN 3 DAYS  CBC,BMP IN 3 DAYS  FALL PRECAUTIONS      Discharge Orders     DNAR per Portable DNAR     Discharge Diet (specify)     Details:    Diet type: Consistent Carbohydrate   Carbohydrate Restriction: Med (45-60 gm/meal)   Discharge instructions - Other (specify)     Comments: F/U PCP IN 3 DAYS  CBC,BMP IN 3 DAYS  FALL PRECAUTIONS        @PATFUTUREAPPT @  Predictive Model Details        20.4% (Medium)  Factor Value   Calculated 11/14/2024 12:04 20% Number of ED visits in last 90 days 3   Readmission Risk Score v2 Model 10% Number of active outpatient medication orders 35    10% Number of hospitalizations in last  year 0    7% Braden score 23    4% Antipsychotics administration present        *Some images could not be shown.

## 2024-11-14 NOTE — Unmapped External Note (Signed)
 Acute Physical Therapy Evaluation  Precautions: Precautions Other Therapy Precautions: Fall risk, Aspiration, Skin Integrity Comment: SAGE protocol   Medical Diagnosis/Course:  PT Diagnosis/Course Pertinent Medical Course: Patient presented to [facility] on 1/16 d/t multiple falls. Presents from [employer]. Of note, Patient reportedly had falls over the past 2 to 3 days provide ED visits on 11/07/2024, 11/08/2024.  ASSESSMENT SUMMARY   The patient is a 79 y.o. y.o. female admitted to [facility] 11/13/2024 for above noted medical diagnosis/course. Pertinent PMH includes dementia, HTN, schizoaffective disorder, COPD, DM2. Patient's functional baseline prior to this admission includes: Patient a poor unreliable historian. Unable to provide accurate details on PLOF.  A Moderate Complexity Physical Therapy evaluation was completed this date. The patient presents with a decline from their functional baseline with deficits noted in the problem list below. Please see functional mobility section below for information pertaining to current assistance levels provided during this session, as well as their home living environment. Patient deficits present as [employer] AM-PAC 6 Clicks Basic Mobility score of 17 indicating 50.11% of functional impairment. Given these deficits and functional limitations, it is evident the patient requires further skilled therapy in order to achieve functional baseline, improve safety, and decrease caregiver burden. Patient will benefit from skilled acute PT services to address deficits for improved safety with functional mobility. PT recommends Post acute therapy at DC for continued functional mobility training to improve independence with all levels of mobility and return toward PLOF.  Per the evaluation and assessment as completed by this therapist, the patient presents with deficits that impair functional mobility and ADLs, requiring a higher level of physical  assistance than what is currently available and safe in the home setting. The patient appears to be most appropriate for multidisciplinary therapy services in the post-acute setting to further improve functional abilities and quality of life via progression towards individualized patient/family-centered goals.   Problem list: Impairments/Limitations: Safety Awareness deficits, Activity Tolerance Deficits, Ambulation Deficits, Balance Deficits, Mobility deficits, Transfer deficits, Bed mobility deficits, Decreased knowledge of condition, Pain limiting function, Muscle weakness   PT Recommendation: PT Recommendation:  (post-acute therapy) PT Equipment Recommended: Defer to next level of care  *Actual disposition location dependent on change in patient functional status, patient/caregiver preferences, medical status, bed availability, and insurance approval.   Maximal anticipated caregiver assistance required at DC:  Continuous one person assistance  Caregiver assistance required for:  assistance with bed mobility, assistance with transfers and/or ambulation, assistance with stairs/ramp, assistance with feeding and/or grooming, assistance with dressing and/or bathing, assistance with toileting, assistance with IADLs (home management, meal preparations/cooking, pet care, shopping), assistance with transportation, and assistance with higher level cognitive tasks (medication management, money management, appointment management, safety awareness)    Home Living Environment: Type of Home Skilled Nursing Facility ([facility])  Home Layout    Exterior Stairs - number    Exterior Stairs - rails    Interior Stairs - number    Interior Stairs - Administrator, Sports / Tub    Financial Planner Currently Using    Additional Comments Patient a poor unreliable historian. Unable to provide accurate details on PLOF.   Prior Level of Function: Level of  Independence Unknown  Lives With    Person(s) able to assist at d/c facility staff  Patient Responsibilities    Requires Assist With     PERTINENT MEDICAL INFORMATION   PT Treatment  Diagnosis:    Reduced Mobility, Unsteadiness on feet, and pain limiting function  Past Medical History: Medical History[1]  Past Surgical History: Surgical History[2]  Lines and Leads: bed/chair exit alarm, saline lock, and telesitter  Orthotics/Prosthetics/Equipment: none   SUBJECTIVE  Communication:  Communication: Patient, Nursing, Clinical Case Management Communication Details: Checked with nursing prior to evaluation and updated nursing regarding patient status post-eval. Discussed D/C recs with case management and patient.     Subjective Fall History Fall in the last year?: Yes Fall/Injury Details: multiple falls reported in chart, bruising in multiple areas on body, laceration to R eye/forehead Feel unsteady or off balance?: Yes  Subjective: to visit  Assessed in the Presence of: alone  Consent for therapy provided by: Patient, RN   OBJECTIVE   Pain: Pain Assessment: Cognitively Impaired/Dementia  Cognition: Orientation Level: Not oriented Not Oriented to: Time, Place, Situation Affect/Behavior: Impacted therapy session Affect/Behavior: Confused, Anxious Overall Cognitive Status: Baseline Cognitively Impaired  Range of Motion: Overall ROM Assessment: Bilateral LEs WFL ROM Additional Comments: BLE appearing to be [facility] through functional assessment  Strength: Overall Strength Assessment: Within Functional Limits Strength Comments: BLE appearing to be [facility] through functional assessment; grossly 4+/5 L knee and 4/5 R knee  Sensation: Sensation Numbness: Patient denied numbness/tingling in UE/LE  Skin Integrity and Edema:  Skin Integrity & Edema Skin: Intact except Exceptions: laceration to R orbital area, bruising R eye/face and scattered in other areas on body,  multiple small scabs scattered Edema:  (R orbital area)   Balance: Sitting - Static: Close supervision Sitting - Dynamic: Contact Guard Assistance Standing - Static: Contact Guard Assistance Standing - Dynamic: Minimal Assistance Balance Additional Comments: Assessed standing balance with use of RW  Functional Mobility:  Bed Mobility Supine to Sit: Close supervision, Extra time, Verbal Cues (HOB raised, supervision for safety, increased time and effort, cues for sequencing) Bed Mobility Additional Comments: maxA for anterior scoot at L hip with cues for anterior weight shift Transfers Assistive Device: Walker-rolling Sit to Stand: Minimal Assistance, Verbal Cues, Tactile cues, Visual Cues (multimodal cues for correct hand placement) Stand to Sit: [place], Verbal Cues, Visual Cues (verbal/visual cues for hand placement on chair amrests, patient with good eccentric control) Mobility Gait Distance (ft): 15 ft Gait Additional Comments: Increased time and effort. Decreased gait speed, cadence, stride length, as well as forward flexed posturing observed. At times needed assist with managiing RW around obstacles, one step verbal commands for step through patterning with cues for RW proximity. Limited in distance d/t pain. Gait Assistance: Minimal Assistance, Extra time, Verbal Cues, Visual Cues Assistive Device: Walker-rolling  Condition After Therapy:  Up in chair, Nursing notified of condition, All needs within reach, Lines intact, Fall interventions in place, Alarm activated  INTERVENTIONS   Skilled PT Treatment/Education Provided  Role and POC of PT, Importance of increasing activity level/participation in therapy, OOB to chair for all meals, Call and wait for assistance, Discharge recs, Precautions, and Use of adaptive equipment/assistive device (Please see Functional Mobility above for educational details pertaining to bed mobility, transfers and/or gait.)     Education:  Education provided to: Patient Response to Education: Patient verbalizes understanding, Patient demonstrates understanding, and Patient needs continued reinforcement  PERFORMANCE OUTCOME MEASURES   AM-PAC - Basic Mobility:    Flowsheet Row Most Recent Value  AM-PAC 6-Clicks - Basic Mobility  Turning from you back to your side while in a flat bed without using bed rails? A little  Moving from lying on  your back to sitting on the side of a flat bed without using bed rails? A little  Moving to and from a bed to a chair (including a wheelchair)? A little  Standing up from a chair using your arms (e.g, wheelchair, or bedside chair)? A little  To walk in a hospital room? A little  Climbing 3-5 steps with a railing? A lot  AM-PAC Total Score 17    PT PLAN OF CARE   Rehab Potential:  Rehab Potential: Fair Complexity/Co-morbidities that Impact POC: Emotional status, Cognitive/Memory deficits, Pain (multiple falls, COPD, HTN) Impairments/Limitations: Safety Awareness deficits, Activity Tolerance Deficits, Ambulation Deficits, Balance Deficits, Mobility deficits, Transfer deficits, Bed mobility deficits, Decreased knowledge of condition, Pain limiting function, Muscle weakness  Plan:  Treatment Plan/Goals Established with Patient/Caregiver: Yes Planned Treatment/Interventions: Patient/Caregiver education, Therapeutic activity, Therapeutic exercise, Gait/mobility training, Neuromuscular re-education, Self care home management PT Frequency: 3x week PT Duration: For 3 weeks  PT Goals:  Encounter Problems     Encounter Problems (Active)     Physical Therapy     Patient Specific Goal 1     Start:  11/14/24    Expected End:  12/04/24      Patient will be able to perform sit <> stand transfers and bed <> chair transfers at close supervision level with RW in order to increase independence and allow the patient to get into/out of seated surfaces at home.        Patient  Specific Goal 2     Start:  11/14/24    Expected End:  12/04/24      Patient will be able to ambulate for a distance of 100' at close supervision level with RW in order to allow them to navigate their home environment safely upon discharge.        Patient Specific Goal 3     Start:  11/14/24    Expected End:  12/04/24      Patient will be able to demonstrate static standing balance at close supervision level and dynamic standing balance at CGA level with RW in order to reduce risk of falls.       Patient Specific Goal 4     Start:  11/14/24    Expected End:  12/04/24      Pt will be able to perform 1-3 sets of 10-15 reps of BLE exercises without rest breaks, for maintenance of strength and endurance while hospitalized, to improve overall activity tolerance and safety with functional mobility upon discharge.           TREATMENT TIME  Time In: 0938 Time Out: 1007 Time in Timed codes: 10 Total Treatment Time: 29 PT Eval Mod Complexity minutes: 19   Treatment/Procedures Time Entry Gait/Mobility minutes: 10   Charges           11/14/2024   Code Description Service Provider Modifiers Quantity  [id number] Hc Pt Gait Training Therapy Tinnie Linker, PT GP 1  [id number] Hc Pt Physical Therapy Evaluation Mod Complex 30 Mins Tinnie Linker, PT GP 1        Time of Service Note Type Status  None           PT Evaluation Complexity History of Comorbidities/Personal Care Impacting Plan of Care: 3-4 personal factors and/or comorbidities Examination of Body Systems: 4 or more elements Presentation of Characteristics: Evolving with changing characteristics PT Eval Complexity Score: 12 PT Eval Complexity Score: Moderate Complexity        [1] Past Medical History:  Diagnosis Date   Acid reflux    Acquired hypothyroidism    Acute blood loss anemia    Allergic rhinitis    Arthritis, multiple joint involvement    At high risk for falls per Deloris fall risk  assessment scale    Benign hypertension with CKD (chronic kidney disease) stage IV    (CMD)    Bilateral dry eyes    Bipolar 1 disorder    (CMD)    Cataract    Chronic bilateral back pain    CKD (chronic kidney disease) stage 4, GFR 15-29 ml/min    (CMD)    Degenerative disc disease, lumbar    Dementia (CMD)    Diuretic-induced hypokalemia    Elevated serum GGT level    Fibromyalgia    Hammer toe of left foot    Hammer toe of right foot    Keratoconjunctivitis sicca of both eyes not specified as Sjogren's    Mild intermittent asthma without complication (CMD)    Multiple fractures of ribs of right side    Polyneuropathy due to secondary diabetes mellitus    (CMD)    Presbyopia OU    Primary osteoarthritis of first carpometacarpal joint of left hand    Primary osteoarthritis of first carpometacarpal joint of right hand    Proximal muscle weakness    Schizoaffective disorder, bipolar type    (CMD)    Spondylosis of lumbar spine    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   Sternal fracture    Type 2 diabetes mellitus with stage 3b chronic kidney disease, without long-term current use of insulin     (CMD) 01/05/2020   Type 2 diabetes mellitus with stage 4 chronic kidney disease, without long-term current use of insulin     (CMD)    Unsteady gait   [2] Past Surgical History: Procedure Laterality Date   CHOLECYSTECTOMY     Procedure: CHOLECYSTECTOMY   EPIDURAL BLOCK INJECTION Bilateral 09/20/2020   Procedure: LUMBAR MEDIAL BRANCH BLOCK L4-S1 bilateral #1/2 Lido 2%;  Surgeon: Janus Von Adora Blanch, MD;  Location: [facility];  Service: Pain Services;  Laterality: Bilateral;   EPIDURAL BLOCK INJECTION Bilateral 09/29/2020   Procedure: LUMBAR MEDIAL BRANCH BLOCK L4-S1 Bilateral 1/2 2% Lidocaine ;  Surgeon: Janus Von Adora Blanch, MD;  Location: [facility];  Service: Pain Services;  Laterality: Bilateral;   EPIDURAL BLOCK  INJECTION Bilateral 10/18/2020   Procedure: LUMBAR MEDIAL BRANCH BLOCK #2/2 L4-S1 BILATERAL;  Surgeon: Janus Von Adora Blanch, MD;  Location: [facility];  Service: Pain Services;  Laterality: Bilateral;   HYSTERECTOMY      Procedure: HYSTERECTOMY   RADIOFREQUENCY ABLATION NERVES Bilateral 11/24/2020   Procedure: LUMBAR RADIOFREQUENCY L4-S1 Bilateral RFAs;  Surgeon: Janus Von Adora Blanch, MD;  Location: [facility];  Service: Pain Services;  Laterality: Bilateral;   RADIOFREQUENCY ABLATION NERVES N/A 04/12/2022   Procedure: RADIOFREQUENCY ABLATION LUMBAR FACET JOINTS Bilateral repeat L4-S1;  Surgeon: Janus Von Adora Blanch, MD;  Location: [facility];  Service: Pain Services;  Laterality: N/A;   TUBAL LIGATION     Procedure: TUBAL LIGATION

## 2024-11-14 NOTE — Progress Notes (Signed)
 Chaplain visited with patient in her room.  She was awake and alert and experiencing a good bit of anxiety, distress, and some mental confusion.  Chaplain provided pastoral presence, empathetic listening, and prayer during the visit.  Patient was very focused on her need to get out and kept saying she had things to do.  Chaplain encouraged patience and acceptance of support from hospital staff and encouraged her to reach out to family for support.  Chaplain remains available for further spiritual support as needed and requested.

## 2024-11-14 NOTE — Progress Notes (Signed)
 Physician's Medical Necessity Certification Form (Required for Medicare / Medicaid Beneficiaries for non-emergency, scheduled or non-scheduled, ambulance transportation)  Ambulance Company and Contact #: [facility]  [phone])  Patient's Name: Marie Sandoval [facility] Social Security Number: [id number] Address: [address] [place] [address] Phone Number: [phone] (home)  Transfer Information  Date of Service: 11/14/24 Origin of Transport:  [facility] Transport Destination:  [facility] [phone])  [address] Phone: [phone] City: [place] State: [place] Please Check: Skilled Nursing Facility  Medical Guidelines  Medicare Regulations, Part 410.40 (d)(1) defines Medical Necessity and Bed Confined In order for ambulance service to be covered, they must be medically necessary and reasonable.  Medical necessity is established when the patients' condition is such that transportation by other means is contraindicated.  The Health Care Financing Administration has defined Bed Confinement as: Patient is unable to get up from bed without assistance Patient is unable to ambulate Patient is unable to sit in a chair or wheelchair  If the said patient does not meet Bed Confined criteria as defined in the Medicare regulation, can this patient be transported safely unmonitored in a wheelchair van?  No  If No, please indicate the appropriate medical condition(s):  Dementia, Severe Weakness  ---------------------------------------------------------------------------------------------------------------------- I certify that the above information is true and correct based on my evaluation of this patient to the best of my knowledge and professional training, as supported in the medical record of this patient.   I understand that this information will be used by the Department of Health and Health And Safety Inspector, Healthcare  Financing Administration to support the determination of medical necessity for ambulance services.  I certify that the above information is true and correct based on my evaluation of this patient to the best of my knowledge and professional training, as supported in the medical record of this patient.  It is understood that I, the Medical Professional; being a Designer, Jewellery, Case Designer, Television/film Set, has received a verbal order from the assigned physician, being Dr. Beauford Olives on the Date of 11/14/2024, to authorize the medical necessity for the transport of the said patient above.  I understand that this information will be used by the Department of Health and Health And Safety Inspector, Healthcare Financing Administration to support the determination of medical necessity for ambulance services.    Signature of Medical Professional:  Henretta Orchard, RN Date:  11/14/2024

## 2024-12-02 ENCOUNTER — Inpatient Hospital Stay (HOSPITAL_COMMUNITY)

## 2024-12-02 ENCOUNTER — Inpatient Hospital Stay (HOSPITAL_COMMUNITY)
Admission: EM | Admit: 2024-12-02 | Discharge: 2024-12-04 | Disposition: A | Discharge status: Skilled Nursing Facility | Source: Skilled Nursing Facility | Attending: Pulmonary Disease | Admitting: Pulmonary Disease

## 2024-12-02 ENCOUNTER — Emergency Department (HOSPITAL_COMMUNITY)

## 2024-12-02 DIAGNOSIS — I10 Essential (primary) hypertension: Secondary | ICD-10-CM | POA: Diagnosis not present

## 2024-12-02 DIAGNOSIS — E119 Type 2 diabetes mellitus without complications: Secondary | ICD-10-CM

## 2024-12-02 DIAGNOSIS — E039 Hypothyroidism, unspecified: Secondary | ICD-10-CM

## 2024-12-02 DIAGNOSIS — F259 Schizoaffective disorder, unspecified: Secondary | ICD-10-CM

## 2024-12-02 DIAGNOSIS — N1832 Chronic kidney disease, stage 3b: Secondary | ICD-10-CM | POA: Diagnosis not present

## 2024-12-02 DIAGNOSIS — W19XXXA Unspecified fall, initial encounter: Principal | ICD-10-CM

## 2024-12-02 DIAGNOSIS — S065XAA Traumatic subdural hemorrhage with loss of consciousness status unknown, initial encounter: Principal | ICD-10-CM | POA: Diagnosis present

## 2024-12-02 DIAGNOSIS — K219 Gastro-esophageal reflux disease without esophagitis: Secondary | ICD-10-CM | POA: Diagnosis not present

## 2024-12-02 LAB — URINALYSIS, ROUTINE W REFLEX MICROSCOPIC
Bilirubin Urine: NEGATIVE
Glucose, UA: 150 mg/dL — AB
Hgb urine dipstick: NEGATIVE
Ketones, ur: NEGATIVE mg/dL
Leukocytes,Ua: NEGATIVE
Nitrite: NEGATIVE
Protein, ur: NEGATIVE mg/dL
Specific Gravity, Urine: 1.016 (ref 1.005–1.030)
pH: 5 (ref 5.0–8.0)

## 2024-12-02 LAB — CBC WITH DIFFERENTIAL/PLATELET
Abs Immature Granulocytes: 0.08 10*3/uL — ABNORMAL HIGH (ref 0.00–0.07)
Basophils Absolute: 0.1 10*3/uL (ref 0.0–0.1)
Basophils Relative: 2 %
Eosinophils Absolute: 0.1 10*3/uL (ref 0.0–0.5)
Eosinophils Relative: 2 %
HCT: 38.1 % (ref 36.0–46.0)
Hemoglobin: 11.9 g/dL — ABNORMAL LOW (ref 12.0–15.0)
Immature Granulocytes: 2 %
Lymphocytes Relative: 20 %
Lymphs Abs: 0.8 10*3/uL (ref 0.7–4.0)
MCH: 32.9 pg (ref 26.0–34.0)
MCHC: 31.2 g/dL (ref 30.0–36.0)
MCV: 105.2 fL — ABNORMAL HIGH (ref 80.0–100.0)
Monocytes Absolute: 0.5 10*3/uL (ref 0.1–1.0)
Monocytes Relative: 12 %
Neutro Abs: 2.5 10*3/uL (ref 1.7–7.7)
Neutrophils Relative %: 62 %
Platelets: 140 10*3/uL — ABNORMAL LOW (ref 150–400)
RBC: 3.62 MIL/uL — ABNORMAL LOW (ref 3.87–5.11)
RDW: 15.1 % (ref 11.5–15.5)
WBC: 4 10*3/uL (ref 4.0–10.5)
nRBC: 0 % (ref 0.0–0.2)

## 2024-12-02 LAB — I-STAT CHEM 8, ED
BUN: 19 mg/dL (ref 8–23)
Calcium, Ion: 1.17 mmol/L (ref 1.15–1.40)
Chloride: 109 mmol/L (ref 98–111)
Creatinine, Ser: 1.2 mg/dL — ABNORMAL HIGH (ref 0.44–1.00)
Glucose, Bld: 93 mg/dL (ref 70–99)
HCT: 34 % — ABNORMAL LOW (ref 36.0–46.0)
Hemoglobin: 11.6 g/dL — ABNORMAL LOW (ref 12.0–15.0)
Potassium: 4 mmol/L (ref 3.5–5.1)
Sodium: 143 mmol/L (ref 135–145)
TCO2: 20 mmol/L — ABNORMAL LOW (ref 22–32)

## 2024-12-02 LAB — COMPREHENSIVE METABOLIC PANEL WITH GFR
ALT: 10 U/L (ref 0–44)
AST: 53 U/L — ABNORMAL HIGH (ref 15–41)
Albumin: 3.4 g/dL — ABNORMAL LOW (ref 3.5–5.0)
Alkaline Phosphatase: 142 U/L — ABNORMAL HIGH (ref 38–126)
Anion gap: 12 (ref 5–15)
BUN: 19 mg/dL (ref 8–23)
CO2: 22 mmol/L (ref 22–32)
Calcium: 9.3 mg/dL (ref 8.9–10.3)
Chloride: 108 mmol/L (ref 98–111)
Creatinine, Ser: 1.2 mg/dL — ABNORMAL HIGH (ref 0.44–1.00)
GFR, Estimated: 46 mL/min — ABNORMAL LOW
Glucose, Bld: 87 mg/dL (ref 70–99)
Potassium: 4.3 mmol/L (ref 3.5–5.1)
Sodium: 141 mmol/L (ref 135–145)
Total Bilirubin: 0.5 mg/dL (ref 0.0–1.2)
Total Protein: 6.3 g/dL — ABNORMAL LOW (ref 6.5–8.1)

## 2024-12-02 LAB — MRSA NEXT GEN BY PCR, NASAL: MRSA by PCR Next Gen: NOT DETECTED

## 2024-12-02 LAB — GLUCOSE, CAPILLARY
Glucose-Capillary: 104 mg/dL — ABNORMAL HIGH (ref 70–99)
Glucose-Capillary: 114 mg/dL — ABNORMAL HIGH (ref 70–99)
Glucose-Capillary: 121 mg/dL — ABNORMAL HIGH (ref 70–99)

## 2024-12-02 LAB — PROTIME-INR
INR: 1 (ref 0.8–1.2)
Prothrombin Time: 14.2 s (ref 11.4–15.2)

## 2024-12-02 LAB — HEMOGLOBIN A1C
Hgb A1c MFr Bld: 5 % (ref 4.8–5.6)
Mean Plasma Glucose: 96.8 mg/dL

## 2024-12-02 LAB — CBG MONITORING, ED: Glucose-Capillary: 88 mg/dL (ref 70–99)

## 2024-12-02 MED ORDER — INSULIN ASPART 100 UNIT/ML IJ SOLN
0.0000 [IU] | INTRAMUSCULAR | Status: DC
  Administered 2024-12-02: 1 [IU] via SUBCUTANEOUS
  Filled 2024-12-02: qty 1

## 2024-12-02 MED ORDER — MONTELUKAST SODIUM 10 MG PO TABS
10.0000 mg | ORAL_TABLET | Freq: Every day | ORAL | Status: DC
  Administered 2024-12-02 – 2024-12-03 (×2): 10 mg via ORAL
  Filled 2024-12-02 (×2): qty 1

## 2024-12-02 MED ORDER — PANTOPRAZOLE SODIUM 40 MG PO TBEC
40.0000 mg | DELAYED_RELEASE_TABLET | Freq: Every day | ORAL | Status: DC
  Administered 2024-12-03 – 2024-12-04 (×2): 40 mg via ORAL
  Filled 2024-12-02 (×2): qty 1

## 2024-12-02 MED ORDER — POLYETHYLENE GLYCOL 3350 17 G PO PACK: 17.0000 g | PACK | Freq: Every day | ORAL | Status: DC | PRN

## 2024-12-02 MED ORDER — IPRATROPIUM-ALBUTEROL 0.5-2.5 (3) MG/3ML IN SOLN: 3.0000 mL | RESPIRATORY_TRACT | Status: DC | PRN

## 2024-12-02 MED ORDER — LABETALOL HCL 5 MG/ML IV SOLN: 5.0000 mg | INTRAVENOUS | Status: DC | PRN

## 2024-12-02 MED ORDER — QUETIAPINE FUMARATE 100 MG PO TABS
100.0000 mg | ORAL_TABLET | Freq: Two times a day (BID) | ORAL | Status: DC
  Administered 2024-12-02 – 2024-12-04 (×4): 100 mg via ORAL
  Filled 2024-12-02 (×4): qty 1

## 2024-12-02 MED ORDER — OLANZAPINE 10 MG IM SOLR
2.5000 mg | Freq: Once | INTRAMUSCULAR | Status: AC | PRN
  Administered 2024-12-02: 2.5 mg via INTRAMUSCULAR
  Filled 2024-12-02 (×2): qty 10

## 2024-12-02 MED ORDER — LORAZEPAM 2 MG/ML IJ SOLN
1.0000 mg | Freq: Once | INTRAMUSCULAR | Status: AC
  Administered 2024-12-02: 1 mg via INTRAVENOUS
  Filled 2024-12-02: qty 1

## 2024-12-02 MED ORDER — LEVOTHYROXINE SODIUM 25 MCG PO TABS
75.0000 ug | ORAL_TABLET | Freq: Every day | ORAL | Status: DC
  Administered 2024-12-03 – 2024-12-04 (×2): 75 ug via ORAL
  Filled 2024-12-02 (×2): qty 3

## 2024-12-02 MED ORDER — STERILE WATER FOR INJECTION IJ SOLN
INTRAMUSCULAR | Status: AC
  Filled 2024-12-02: qty 10

## 2024-12-02 MED ORDER — SENNA 8.6 MG PO TABS: 1.0000 | ORAL_TABLET | Freq: Two times a day (BID) | ORAL | Status: DC | PRN

## 2024-12-02 MED ORDER — SERTRALINE HCL 50 MG PO TABS
100.0000 mg | ORAL_TABLET | Freq: Every day | ORAL | Status: DC
  Administered 2024-12-03 – 2024-12-04 (×2): 100 mg via ORAL
  Filled 2024-12-02 (×2): qty 2

## 2024-12-02 MED ORDER — ACETAMINOPHEN 325 MG PO TABS
650.0000 mg | ORAL_TABLET | ORAL | Status: DC | PRN
  Administered 2024-12-02 – 2024-12-03 (×4): 650 mg via ORAL
  Filled 2024-12-02 (×4): qty 2

## 2024-12-02 MED ORDER — CHLORHEXIDINE GLUCONATE CLOTH 2 % EX PADS
6.0000 | MEDICATED_PAD | Freq: Every day | CUTANEOUS | Status: DC
  Administered 2024-12-02 – 2024-12-04 (×3): 6 via TOPICAL

## 2024-12-02 NOTE — Hospital Course (Signed)
 

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
## 2024-12-02 NOTE — ED Provider Notes (Signed)
 " [place] [facility] AT [facility] Provider Note   CSN: [id number] Arrival date & time: 12/02/24  9256     Patient presents with: Marie Sandoval is a 79 y.o. female.   HPI Elderly female with dementia presents after a fall.  History per EMS, the patient cannot provide any details, level 5 caveat, dementia. Patient also noted to have schizoaffective disorder fibromyalgia polyneuropathy. Per report patient was found on her back in the bathroom at her rehab center.  Patient with mild hypotension otherwise vitals unremarkable in transport.    Prior to Admission medications  Medication Sig Start Date End Date Taking? Authorizing Provider  acetaminophen  (TYLENOL ) 500 MG tablet Take 1,000 mg by mouth in the morning, at noon, and at bedtime.   Yes [provider]  albuterol  (VENTOLIN  HFA) 108 (90 Base) MCG/ACT inhaler Inhale 2 puffs into the lungs every 6 (six) hours as needed for wheezing.   Yes [provider]  amLODipine  (NORVASC ) 5 MG tablet Take 1 tablet by mouth daily. 12/21/20  Yes [provider]  azelastine (OPTIVAR) 0.05 % ophthalmic solution INSTILL 1 DROP INTO EACH EYE TWICE DAILY 08/09/15  Yes [provider]  bisacodyl (DULCOLAX) 10 MG suppository Place 10 mg rectally daily as needed (constipation not relieved by mom).   Yes [provider]  clonazePAM  (KLONOPIN ) 1 MG tablet Take 1 mg by mouth in the morning, at noon, and at bedtime.   Yes [provider]  clonazePAM  (KLONOPIN ) 1 MG tablet Take 1 mg by mouth daily as needed (agitation).   Yes [provider]  Cold Sore Products (ANBESOL COLD SORE THERAPY) OINT Apply 1 Application topically in the morning and at bedtime. Apply to lower right lip   Yes [provider]  diclofenac Sodium (VOLTAREN) 1 % GEL Apply 2 g topically in the morning, at noon, and at bedtime. Apply to right knee and shoulder   Yes [provider]   fluticasone (FLONASE) 50 MCG/ACT nasal spray 2 sprays by Each Nare route 2 times daily. 01/25/17  Yes [provider]  glipiZIDE  (GLUCOTROL ) 10 MG tablet Take 1 tablet by mouth daily. 02/07/17  Yes [provider]  JARDIANCE 25 MG TABS tablet Take 25 mg by mouth daily.   Yes [provider]  levothyroxine  (SYNTHROID ) 75 MCG tablet Take 75 mcg by mouth in the morning. 10/10/16  Yes [provider]  linagliptin  (TRADJENTA ) 5 MG TABS tablet Take 1 tablet by mouth daily. 12/21/20  Yes [provider]  lisinopril  (ZESTRIL ) 5 MG tablet Take 1 tablet by mouth daily. 10/10/16  Yes [provider]  loperamide (IMODIUM) 2 MG capsule Take 2 mg by mouth every 6 (six) hours as needed for diarrhea or loose stools.   Yes [provider]  magnesium hydroxide (MILK OF MAGNESIA) 400 MG/5ML suspension Take 30 mLs by mouth daily as needed (no BM in 3 days).   Yes [provider]  melatonin 5 MG TABS Take 5 mg by mouth at bedtime.   Yes [provider]  montelukast  (SINGULAIR ) 10 MG tablet Take 10 mg by mouth every evening.   Yes [provider]  omeprazole (PRILOSEC) 40 MG capsule Take 40 mg by mouth in the morning and at bedtime. 01/09/21  Yes [provider]  polyethylene glycol (MIRALAX  / GLYCOLAX ) 17 g packet Take 17 g by mouth daily as needed for mild constipation.   Yes [provider]  QUEtiapine  (SEROQUEL )  100 MG tablet Take 100 mg by mouth 2 (two) times daily.   Yes [provider]  QUEtiapine  (SEROQUEL ) 50 MG tablet Take 50 mg by mouth daily in the afternoon.   Yes [provider]  saccharomyces boulardii (FLORASTOR) 250 MG capsule Take 250 mg by mouth daily.   Yes [provider]  Sertraline  HCl 150 MG CAPS Take 150 mg by mouth daily.   Yes [provider]  Sodium Phosphates (ENEMA RE) Place 1 Dose rectally daily as needed (constipation no relieved by dulcolax).   Yes  [provider]  temazepam  (RESTORIL ) 15 MG capsule Take 1 capsule by mouth at bedtime. 04/26/20  Yes [provider]  traMADol (ULTRAM) 50 MG tablet Take 50 mg by mouth 3 (three) times daily.   Yes [provider]  traZODone  (DESYREL ) 100 MG tablet Take 100 mg by mouth at bedtime. 06/05/20  Yes [provider]  UNABLE TO FIND Take 120 mLs by mouth in the morning and at bedtime. Med Name: Med pass   Yes [provider]    Allergies: Buprenorphine, Gabapentin, Oxycodone-aspirin, Sulfamethoxazole, Oxycodone, and Sulfa antibiotics    Review of Systems  Updated Vital Signs BP 125/64 (BP Location: Left Arm)   Pulse 89   Temp 98.8 F (37.1 C) (Oral)   Resp 19   Ht 1.676 m (5' 6)   Wt 59.1 kg   SpO2 95%   BMI 21.03 kg/m   Physical Exam Vitals and nursing note reviewed.  Constitutional:      General: She is not in acute distress.    Appearance: She is well-developed.  HENT:     Head: Normocephalic.      Comments:  unable to fully assess secondary to cervical collar will require repeat evaluation. Eyes:     Conjunctiva/sclera: Conjunctivae normal.  Cardiovascular:     Rate and Rhythm: Normal rate and regular rhythm.  Pulmonary:     Effort: Pulmonary effort is normal. No respiratory distress.     Breath sounds: Normal breath sounds. No stridor.  Abdominal:     General: There is no distension.  Musculoskeletal:     Comments: No obvious deformities in the patient moves all 4 extremities spontaneously throughout initial exam  Skin:    General: Skin is warm and dry.  Neurological:     Mental Status: She is alert.     Cranial Nerves: No cranial nerve deficit.     Comments: Patient moves all extremity spontaneously, face is symmetric, speech is animated, without clear.  Patient does not follow commands reliably immediately, but initial neuroexam not overtly abnormal  Psychiatric:        Mood and Affect: Mood is anxious. Affect is labile and  tearful.        Speech: Speech normal.        Cognition and Memory: Cognition is impaired. Memory is impaired.     (all labs ordered are listed, but only abnormal results are displayed) Labs Reviewed  CBC WITH DIFFERENTIAL/PLATELET - Abnormal; Notable for the following components:      Result Value   RBC 3.62 (*)    Hemoglobin 11.9 (*)    MCV 105.2 (*)    Platelets 140 (*)    Abs Immature Granulocytes 0.08 (*)    All other components within normal limits  COMPREHENSIVE METABOLIC PANEL WITH GFR - Abnormal; Notable for the following components:   Creatinine, Ser 1.20 (*)    Total Protein 6.3 (*)  Albumin 3.4 (*)    AST 53 (*)    Alkaline Phosphatase 142 (*)    GFR, Estimated 46 (*)    All other components within normal limits  URINALYSIS, ROUTINE W REFLEX MICROSCOPIC - Abnormal; Notable for the following components:   Glucose, UA 150 (*)    All other components within normal limits  GLUCOSE, CAPILLARY - Abnormal; Notable for the following components:   Glucose-Capillary 104 (*)    All other components within normal limits  I-STAT CHEM 8, ED - Abnormal; Notable for the following components:   Creatinine, Ser 1.20 (*)    TCO2 20 (*)    Hemoglobin 11.6 (*)    HCT 34.0 (*)    All other components within normal limits  MRSA NEXT GEN BY PCR, NASAL  PROTIME-INR  HEMOGLOBIN A1C  CBG MONITORING, ED    EKG: EKG Interpretation Date/Time:  Wednesday December 02 2024 07:57:00 EST Ventricular Rate:  82 PR Interval:  187 QRS Duration:  108 QT Interval:  402 QTC Calculation: 470 R Axis:   57  Text Interpretation: Sinus rhythm Confirmed by Garrick Charleston [phone]) on 12/02/2024 9:47:53 AM  Radiology: CT Head Wo Contrast Addendum Date: 12/02/2024  ADDENDUM #1ADDENDUM: Study discussed by telephone with Dr Garrick at [phone] hours on December 02, 2024. ---------------------------------------------------- Electronically signed by: Helayne Hurst MD 12/02/2024 10:22 AM EST RP Workstation:  [id number]   Result Date: 12/02/2024 EXAM: CT HEAD WITHOUT CONTRAST 12/02/2024 09:32:00 AM TECHNIQUE: CT of the head was performed without the administration of intravenous contrast. Automated exposure control, iterative reconstruction, and/or weight based adjustment of the mA/kV was utilized to reduce the radiation dose to as low as reasonably achievable. COMPARISON: Brain MRI 10/05/2022, CT head 11/07/2022. CLINICAL HISTORY: 79 year old female. Polytrauma, blunt. Observed fall in bathroom, found down with head laceration. FINDINGS: BRAIN AND VENTRICLES: Mixed density left sided subdural hematoma measures up to 6 mm thickness on axial and coronal images. Associated mild intracranial mass effect with rightward midline shift of 3 mm. Normal basilar cisterns. No other acute intracranial hemorrhage identified. Stable and normal for age gray white differentiation. No evidence of acute infarct. No hydrocephalus. ORBITS: No acute abnormality. SINUSES: Visible paranasal sinuses, tympanic cavities, and mastoids remain well aerated. SOFT TISSUES AND SKULL: Right vertex scalp soft tissue injury with soft tissue swelling, small volume of scattered soft tissue gas, and overlying skin staples. Emphysema and atherosclerosis at the skull base. No skull fracture. TRAUMATIC BRAIN INJURY RISK STRATIFICATION Skull fracture: None (Low - mBIG 1). Subdural hematoma (SDH): 4 to < 8 mm (mBIG 2). Subarachnoid hemorrhage [facility]): None or Trace - up to 2 sulci (Low - mBIG 1). Epidural hematoma (EDH): None (Low - mBIG 1). Cerebral contusion or intra-axial or intraparenchymal hemorrhage (IPH): None or Solitary < 4 mm (Low - mBIG 1). Intraventricular hemorrhage (IVH): None (Low - mBIG 1). Midline shift > 1mm or edema/effacement of sulci or vents: Yes (High - mBIG 3). IMPRESSION: 1. Coup-contrecoup injury with Left subdural hematoma (6 mm) and 3 mm rightward midline shift. 2. Right vertex scalp soft tissue injury, no skull fracture. 3. Brain  injury risk stratification listed above. Electronically signed by: Helayne Hurst MD 12/02/2024 09:48 AM EST RP Workstation: [id number]   CT Cervical Spine Wo Contrast Result Date: 12/02/2024 EXAM: CT CERVICAL SPINE WITHOUT CONTRAST 12/02/2024 09:32:00 AM TECHNIQUE: CT of the cervical spine was performed without the administration of intravenous contrast. Multiplanar reformatted images are provided for review. Automated exposure control, iterative reconstruction, and/or weight based adjustment  of the mA/kV was utilized to reduce the radiation dose to as low as reasonably achievable. COMPARISON: CT head reported separately 12/02/2024. Cervical spine CT 11/07/2024. CLINICAL HISTORY: 79 year old female. Polytrauma, blunt. Observed fall in bathroom, found down with head laceration. FINDINGS: BONES AND ALIGNMENT: Stable cervical lordosis with mild degenerative appearing anterolisthesis of C4 on C5. Chronic C7 anterior wedge compression is stable. No acute fracture or traumatic malalignment. DEGENERATIVE CHANGES: Chronic severe C1-C2 degeneration asymmetric to the left. Chronic degenerative appearing facet ankylosis bilaterally C2-C4. Degenerative C5-C6 facet ankylosis also on the left side. Chronic severe C1-C2 degeneration asymmetric to the left. SOFT TISSUES: No prevertebral soft tissue swelling. Mild for age cervical carotid calcified atherosclerosis. CHEST: Partially visible calcified pleural plaques in the upper chest. Calcified atherosclerosis of the aortic arch is visible. Grossly intact visible upper thoracic levels. IMPRESSION: 1. No acute traumatic injury identified in the cervical spine. 2. Chronic cervical spine degeneration superimposed on multilevel degenerative facet ankylosis. 3. Aortic Atherosclerosis (ICD10-I70.0). Electronically signed by: Helayne Hurst MD 12/02/2024 09:52 AM EST RP Workstation: [id number]     Procedures   Medications Ordered in the ED  levothyroxine  (SYNTHROID ) tablet 75 mcg (has  no administration in time range)  pantoprazole  (PROTONIX ) EC tablet 40 mg (has no administration in time range)  montelukast  (SINGULAIR ) tablet 10 mg (has no administration in time range)  ipratropium-albuterol  (DUONEB) 0.5-2.5 (3) MG/3ML nebulizer solution 3 mL (has no administration in time range)  insulin  aspart (novoLOG ) injection 0-9 Units (0 Units Subcutaneous Not Given 12/02/24 1207)  Chlorhexidine  Gluconate Cloth 2 % PADS 6 each (6 each Topical Given 12/02/24 1358)  polyethylene glycol (MIRALAX  / GLYCOLAX ) packet 17 g (has no administration in time range)  senna (SENOKOT) tablet 8.6 mg (has no administration in time range)  acetaminophen  (TYLENOL ) tablet 650 mg (has no administration in time range)  labetalol  (NORMODYNE ) injection 5-10 mg (has no administration in time range)  QUEtiapine  (SEROQUEL ) tablet 100 mg (has no administration in time range)  sertraline  (ZOLOFT ) tablet 100 mg (has no administration in time range)  LORazepam  (ATIVAN ) injection 1 mg (1 mg Intravenous Given 12/02/24 0941)                                    Medical Decision Making Cardiac 75 sinus normal pulse ox 97% room air normal  Elderly female now after unwitnessed fall overtly unremarkable neuroexam, the dementia is limiting.  Concern for fall versus dehydration versus electrolyte abnormalities with intracranial abnormality, fracture all considered.  Amount and/or Complexity of Data Reviewed Independent Historian: EMS External Data Reviewed: notes.    Details: Nursing home notes arrive with patient, notable for problems as above. Labs: ordered. Radiology: ordered. ECG/medicine tests: ordered.  Risk Prescription drug management. Decision regarding hospitalization. Diagnosis or treatment significantly limited by social determinants of health.  LACERATION REPAIR Performed by: Lamar Salen Authorized by: Lamar Salen Consent: Verbal consent obtained. Risks and benefits: risks, benefits and  alternatives were discussed Consent given by: patient Patient identity confirmed: provided demographic data Prepped and Draped in normal sterile fashion Wound explored  Laceration Location: scalp  Laceration Length: 4cm  No Foreign Bodies seen or palpated  Irrigation method: syringe Amount of cleaning: standard  Skin closure: staples  Number of staples - 2  Technique: close  Patient tolerance: Patient tolerated the procedure well with no immediate complications.  Update: Patient's case discussed with radiology, neurosurgery given abnormal head CT concerning for  subdural hematoma with midline shift.  On repeat exam patient remains in similar condition.  Per neurosurgery patient will require admission, ICU with repeat imaging, best at our affiliated facility, facilitation of transfer initiated.  ED diagnosis: Fall Subdural hematoma Scalp laceration  CRITICAL CARE Performed by: Lamar Salen Total critical care time: 40 minutes Critical care time was exclusive of separately billable procedures and treating other patients. Critical care was necessary to treat or prevent imminent or life-threatening deterioration. Critical care was time spent personally by me on the following activities: development of treatment plan with patient and/or surrogate as well as nursing, discussions with consultants, evaluation of patient's response to treatment, examination of patient, obtaining history from patient or surrogate, ordering and performing treatments and interventions, ordering and review of laboratory studies, ordering and review of radiographic studies, pulse oximetry and re-evaluation of patient's condition.     Salen Lamar, MD 12/02/24 1615  "

## 2024-12-02 NOTE — Evaluation (Signed)
 Physical Therapy Evaluation Patient Details Name: Marie Sandoval MRN: [id number] DOB: [birth year] Today's Date: 12/02/2024  History of Present Illness  79 y.o. female presents to [facility] hospital on 12/02/2024 after a fall, found to have L SDH with midline shift. PMH includes HTN, DMII, dementia, schizoaffective disorder, COPD.  Clinical Impression  Pt presents to PT with deficits in gait, balance, cognition, endurance, strength, power. Pt is very anxious and demonstrates poor memory and insight into current situation and deficits, similar to when this PT evaluated the patient in August of 2025. Pt requires cueing intermittently to utilize the RW but is able to walk for short household distances and maintain balance when using the RW without physical assistance. Pt does not appear far from her baseline in August, and at that time the patient's daughter, Asberry, had stated that the patient would often forget to utilize her RW or utilize it in an improper manner. PT will continue to follow in the acute setting in an effort to improve balance and to attempt to reinforce DME utilization. PT recommends a return to [facility] when medically appropriate, no post-acute PT services are indicated.        If plan is discharge home, recommend the following: A little help with walking and/or transfers;A lot of help with bathing/dressing/bathroom;Assistance with cooking/housework;Direct supervision/assist for medications management;Direct supervision/assist for financial management;Assist for transportation;Help with stairs or ramp for entrance;Supervision due to cognitive status   Can travel by private vehicle   Yes    Equipment Recommendations None recommended by PT  Recommendations for Other Services       Functional Status Assessment Patient has had a recent decline in their functional status and demonstrates the ability to make significant improvements in function in a reasonable and predictable amount of time.      Precautions / Restrictions Precautions Precautions: Fall Recall of Precautions/Restrictions: Impaired Precaution/Restrictions Comments: dementia Restrictions Weight Bearing Restrictions Per Provider Order: No      Mobility  Bed Mobility Overal bed mobility: Needs Assistance Bed Mobility: Supine to Sit, Sit to Supine     Supine to sit: Min assist Sit to supine: Max assist        Transfers Overall transfer level: Needs assistance Equipment used: Rolling walker (2 wheels) Transfers: Sit to/from Stand Sit to Stand: Contact guard assist, From elevated surface (from ICU bed)                Ambulation/Gait Ambulation/Gait assistance: Contact guard assist Gait Distance (Feet): 50 Feet Assistive device: Rolling walker (2 wheels) Gait Pattern/deviations: Step-to pattern Gait velocity: reduced Gait velocity interpretation: <1.31 ft/sec, indicative of household ambulator   General Gait Details: slowed step-to gait, verbal and tactile cues to provide instruction for walking path, pt requries a reminder to bring RW with her when changing direction once during ambulation bout  Stairs            Wheelchair Mobility     Tilt Bed    Modified Rankin (Stroke Patients Only)       Balance Overall balance assessment: Needs assistance Sitting-balance support: No upper extremity supported, Feet supported Sitting balance-Leahy Scale: Fair     Standing balance support: Single extremity supported, Reliant on assistive device for balance Standing balance-Leahy Scale: Poor                               Pertinent Vitals/Pain Pain Assessment Pain Assessment: No/denies pain    Home Living  Family/patient expects to be discharged to:: Skilled nursing facility                   Additional Comments: residing at [facility]    Prior Function Prior Level of Function : History of Falls (last six months);Needs assist             Mobility  Comments: pt ambulates with use of a RW, often forgets to utilize device. history of multiple falls ADLs Comments: no family or caregivers present to provide recent update on ADLs     Extremity/Trunk Assessment   Upper Extremity Assessment Upper Extremity Assessment: Generalized weakness    Lower Extremity Assessment Lower Extremity Assessment: Generalized weakness    Cervical / Trunk Assessment Cervical / Trunk Assessment: Kyphotic  Communication   Communication Communication: Impaired Factors Affecting Communication: Difficulty expressing self    Cognition Arousal: Alert Behavior During Therapy: Anxious   PT - Cognitive impairments: History of cognitive impairments                       PT - Cognition Comments: pt is oriented to person, very distraught and anxious. Following commands: Impaired Following commands impaired: Follows one step commands with increased time, Follows multi-step commands inconsistently     Cueing Cueing Techniques: Verbal cues, Tactile cues, Visual cues     General Comments General comments (skin integrity, edema, etc.): VSS on RA    Exercises     Assessment/Plan    PT Assessment Patient needs continued PT services  PT Problem List Decreased strength;Decreased activity tolerance;Decreased balance;Decreased knowledge of use of DME;Decreased knowledge of precautions;Decreased safety awareness;Decreased cognition;Decreased mobility       PT Treatment Interventions DME instruction;Functional mobility training;Gait training;Therapeutic activities;Therapeutic exercise;Balance training;Neuromuscular re-education;Cognitive remediation;Patient/family education    PT Goals (Current goals can be found in the Care Plan section)  Acute Rehab PT Goals Patient Stated Goal: to reduce falls risk PT Goal Formulation: With patient Time For Goal Achievement: 12/16/24 Potential to Achieve Goals: Fair    Frequency Min 2X/week      Co-evaluation               AM-PAC PT 6 Clicks Mobility  Outcome Measure Help needed turning from your back to your side while in a flat bed without using bedrails?: A Little Help needed moving from lying on your back to sitting on the side of a flat bed without using bedrails?: A Little Help needed moving to and from a bed to a chair (including a wheelchair)?: A Little Help needed standing up from a chair using your arms (e.g., wheelchair or bedside chair)?: A Little Help needed to walk in hospital room?: A Little Help needed climbing 3-5 steps with a railing? : Total 6 Click Score: 16    End of Session Equipment Utilized During Treatment: Gait belt Activity Tolerance: Patient tolerated treatment well Patient left: in bed;with call bell/phone within reach;with bed alarm set;Other (comment);with nursing/sitter in room (bilateral mitts) Nurse Communication: Mobility status PT Visit Diagnosis: Other abnormalities of gait and mobility (R26.89);History of falling (Z91.81)    Time: 8351-8290 PT Time Calculation (min) (ACUTE ONLY): 21 min   Charges:   PT Evaluation $PT Eval Low Complexity: 1 Low   PT General Charges $$ ACUTE PT VISIT: 1 Visit         Bernardino JINNY Ruth, PT, DPT Acute Rehabilitation Office [phone]   Bernardino JINNY Ruth 12/02/2024, 5:56 PM

## 2024-12-02 NOTE — Progress Notes (Signed)
 eLink Physician-Brief Progress Note Patient Name: Marie Sandoval DOB: [birth year] MRN: [id number]   Date of Service  12/02/2024  HPI/Events of Note  Patient with agitated delirium, bedside RN requesting medication for patient on-call to CT to enable patient tolerate the procedure.  eICU Interventions  Zyprexa  2.5 mg IM x 1 PRN agitation ordered.        Aaryan Essman U Lennyn Bellanca 12/02/2024, 7:37 PM

## 2024-12-02 NOTE — Progress Notes (Signed)
 79 y/o F w/ hx dementia who fell and has a left convexity SDH with most acute but some chronic components. Reportedly neuro intact  SBP<140 Transfer to [facility] ICU for overnight admission Repeat CT head in 6 hours Activity and diet as tolerated Hold all anticoagulation and antiplatelets
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
## 2024-12-03 DIAGNOSIS — G4700 Insomnia, unspecified: Secondary | ICD-10-CM | POA: Diagnosis not present

## 2024-12-03 DIAGNOSIS — I10 Essential (primary) hypertension: Secondary | ICD-10-CM | POA: Diagnosis not present

## 2024-12-03 DIAGNOSIS — E119 Type 2 diabetes mellitus without complications: Secondary | ICD-10-CM | POA: Diagnosis not present

## 2024-12-03 DIAGNOSIS — E039 Hypothyroidism, unspecified: Secondary | ICD-10-CM | POA: Diagnosis not present

## 2024-12-03 DIAGNOSIS — S065XAA Traumatic subdural hemorrhage with loss of consciousness status unknown, initial encounter: Secondary | ICD-10-CM | POA: Diagnosis not present

## 2024-12-03 DIAGNOSIS — F259 Schizoaffective disorder, unspecified: Secondary | ICD-10-CM | POA: Diagnosis not present

## 2024-12-03 DIAGNOSIS — N1832 Chronic kidney disease, stage 3b: Secondary | ICD-10-CM | POA: Diagnosis not present

## 2024-12-03 LAB — BASIC METABOLIC PANEL WITH GFR
Anion gap: 11 (ref 5–15)
BUN: 15 mg/dL (ref 8–23)
CO2: 22 mmol/L (ref 22–32)
Calcium: 8.8 mg/dL — ABNORMAL LOW (ref 8.9–10.3)
Chloride: 108 mmol/L (ref 98–111)
Creatinine, Ser: 1.07 mg/dL — ABNORMAL HIGH (ref 0.44–1.00)
GFR, Estimated: 53 mL/min — ABNORMAL LOW
Glucose, Bld: 90 mg/dL (ref 70–99)
Potassium: 3.8 mmol/L (ref 3.5–5.1)
Sodium: 141 mmol/L (ref 135–145)

## 2024-12-03 LAB — GLUCOSE, CAPILLARY
Glucose-Capillary: 90 mg/dL (ref 70–99)
Glucose-Capillary: 135 mg/dL — ABNORMAL HIGH (ref 70–99)
Glucose-Capillary: 219 mg/dL — ABNORMAL HIGH (ref 70–99)
Glucose-Capillary: 119 mg/dL — ABNORMAL HIGH (ref 70–99)

## 2024-12-03 LAB — CBC
HCT: 33.2 % — ABNORMAL LOW (ref 36.0–46.0)
Hemoglobin: 11.1 g/dL — ABNORMAL LOW (ref 12.0–15.0)
MCH: 33.5 pg (ref 26.0–34.0)
MCHC: 33.4 g/dL (ref 30.0–36.0)
MCV: 100.3 fL — ABNORMAL HIGH (ref 80.0–100.0)
Platelets: 113 10*3/uL — ABNORMAL LOW (ref 150–400)
RBC: 3.31 MIL/uL — ABNORMAL LOW (ref 3.87–5.11)
RDW: 14.9 % (ref 11.5–15.5)
WBC: 3.7 10*3/uL — ABNORMAL LOW (ref 4.0–10.5)
nRBC: 0 % (ref 0.0–0.2)

## 2024-12-03 MED ORDER — CLONAZEPAM 1 MG PO TABS
1.0000 mg | ORAL_TABLET | Freq: Three times a day (TID) | ORAL | Status: DC
  Administered 2024-12-03 – 2024-12-04 (×4): 1 mg via ORAL
  Filled 2024-12-03 (×4): qty 1

## 2024-12-03 MED ORDER — TRAZODONE HCL 50 MG PO TABS
100.0000 mg | ORAL_TABLET | Freq: Every day | ORAL | Status: DC
  Administered 2024-12-03: 100 mg via ORAL
  Filled 2024-12-03: qty 2

## 2024-12-03 MED ORDER — QUETIAPINE FUMARATE 50 MG PO TABS
50.0000 mg | ORAL_TABLET | Freq: Every day | ORAL | Status: DC
  Administered 2024-12-03: 50 mg via ORAL
  Filled 2024-12-03: qty 1
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
## 2024-12-03 NOTE — Progress Notes (Signed)
 Speech Language Pathology Treatment: Dysphagia  Patient Details Name: Marie Sandoval MRN: [id number] DOB: [birth year] Today's Date: 12/03/2024 Time: 8296-8288 SLP Time Calculation (min) (ACUTE ONLY): 8 min  Assessment / Plan / Recommendation Clinical Impression  Pt is more confused compared to previous date but is self-feeding independently without s/s of dysphagia or aspiration. Continue current diet without ongoing SLP f/u.   HPI HPI: Marie Sandoval is a 79 yo female admitted 2/4 after an unwitnessed fall at SNF. Recently admitted at [facility] after multiple falls 1/16. CTH shows coup-contrecoup injury with L SDH with rightward midline shift. Neurosurgery recommending conservative management. PMH: HTN, T2DM, hypothyroidism, dementia, schizoaffective disorder, COPD, GERD      SLP Plan  All goals met        Swallow Evaluation Recommendations   Recommendations: PO diet PO Diet Recommendation: Regular;Thin liquids (Level 0) Liquid Administration via: Cup;Straw Medication Administration: Whole meds with liquid Supervision: Patient able to self-feed;Intermittent supervision/cueing for swallowing strategies Postural changes: Position pt fully upright for meals Oral care recommendations: Oral care BID (2x/day)     Recommendations                     Oral care BID   PRN Dysphagia, unspecified (R13.10)     All goals met     Damien Blumenthal, M.A., CCC-SLP Speech Language Pathology, Acute Rehabilitation Services  Secure Chat preferred [phone]   12/03/2024, 5:32 PM

## 2024-12-03 NOTE — TOC CAGE-AID Note (Signed)
 Transition of Care [facility]) - CAGE-AID Screening   Patient Details  Name: Marie Sandoval MRN: [id number] Date of Birth: 30-Sep-1946  Transition of Care [facility]) CM/SW Contact:    Elysia Grand E Evelia Waskey, LCSW Phone Number: 12/03/2024, 9:30 AM   Clinical Narrative: Disoriented x 3.   CAGE-AID Screening: Substance Abuse Screening unable to be completed due to: : Patient unable to participate

## 2024-12-03 NOTE — Progress Notes (Signed)
 OT Cancellation Note - OT Screen  Patient Details Name: Marie Sandoval MRN: [id number] DOB: April 17, 1946   Cancelled Treatment:    Reason Eval/Treat Not Completed: OT screened, no needs identified, will sign off (Pt currently performing ADLs and mobility with Supervision and cues for sequencing, safety, and thoroughness, which is pt's baseline per RN and chart review. No benefit from acute OT at this time; no post-acute OT needs anticipated. OT signing off.)  Margarie Rockey HERO., OTR/L, MA Acute Rehab [phone]   Margarie FORBES Horns 12/03/2024, 3:22 PM

## 2024-12-03 NOTE — TOC Initial Note (Signed)
 Transition of Care [facility]) - Initial/Assessment Note    Patient Details  Name: Marie Sandoval MRN: [id number] Date of Birth: 08-08-1946  Transition of Care [facility]) CM/SW Contact:    Gwenn Julien Norris, [place] Phone Number: 12/03/2024, 12:43 PM  Clinical Narrative: Pt admitted from [facility] where she is a LTC/SNF resident. Spoke to [place] in admissions who confirmed pt is able to return at dc. SW will follow.   Julien Gwenn, MSW, LCSW [phone] (coverage)                    Expected Discharge Plan: Skilled Nursing Facility Barriers to Discharge: Continued Medical Work up   Patient Goals and CMS Choice            Expected Discharge Plan and Services     Post Acute Care Choice: Skilled Nursing Facility Living arrangements for the past 2 months: Skilled Nursing Facility                                      Prior Living Arrangements/Services Living arrangements for the past 2 months: Skilled Nursing Facility Lives with:: Facility Resident                   Activities of Daily Living      Permission Sought/Granted                  Emotional Assessment       Orientation: : Fluctuating Orientation (Suspected and/or reported Sundowners), Oriented to Self Alcohol / Substance Use: Not Applicable Psych Involvement: No (comment)  Admission diagnosis:  SDH (subdural hematoma) (HCC) [S06.5XAA] Patient Active Problem List   Diagnosis Date Noted   SDH (subdural hematoma) (HCC) 12/02/2024   Cortical age-related cataract of both eyes 01/28/2024   Dementia, neurological (HCC) 08/16/2022   COPD (chronic obstructive pulmonary disease) (HCC) 03/13/2021   Diabetes mellitus (HCC) 03/13/2021   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   Anxiety disorder 03/11/2021   Chronic bilateral back pain 08/18/2018   Polyneuropathy due to secondary diabetes mellitus (HCC) 11/26/2017   Asthma 03/08/2014   Acquired hypothyroidism 03/08/2014   Fibromyalgia  03/08/2014   Gastroesophageal reflux disease without esophagitis 03/08/2014   Migraine 03/08/2014   Restless legs syndrome (RLS) 03/08/2014   Stage 3b chronic kidney disease (HCC) 03/08/2014   Hypomagnesemia 02/20/2014   Hypokalemia 02/17/2014   HTN (hypertension) 02/17/2014   Sternal fracture 02/13/2014   Schizoaffective disorder (HCC) 02/13/2014   Liver function test abnormality 02/13/2014   PCP:  System, Provider Not In Pharmacy:  No Pharmacies Listed    Social Drivers of Health (SDOH) Social History: SDOH Screenings   Food Insecurity: Unknown (11/13/2024)   Received from [facility]  Utilities: Unknown (11/13/2024)   Received from [facility]  Financial Resource Strain: Patient Declined (11/13/2024)   Received from [facility]  Social Connections: Unknown (11/13/2024)   Received from [facility]  Tobacco Use: Low Risk (06/02/2024)   SDOH Interventions:     Readmission Risk Interventions     No data to display

## 2024-12-03 NOTE — Progress Notes (Signed)
 I had a long detailed conversation with her daughter Asberry regarding options moving forward.  Given her overall clinical picture I am not in favor of any form of open surgical intervention.  One option would be to perform an MMA embolization while she is here to help prevent worsening of the chronic subdural and potentially mitigate her risk with future falls.  Another option is to simply do nothing or essentially neurosurgery comfort care.  In the end, the patient's daughters are not interested in any form of neurosurgical or IR intervention.  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  I explained that enlargement of her chronic subdural hematoma could lead to a variety of issues including worsening confusion, headache, lethargy, strokelike symptoms, and even death.  Her daughter verbalized understanding  Conclusion: Patient's family is not interested in any form of neurosurgical intervention No further imaging needed from my end Can discharge when able Would hold off on anticoagulation and antiplatelets. If necessary can start DVT ppx on 2/11 Follow-up prn
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
## 2024-12-03 NOTE — Progress Notes (Signed)
 OT Cancellation Note  Patient Details Name: Marie Sandoval MRN: [id number] DOB: 1946-07-25   Cancelled Treatment:    Reason Eval/Treat Not Completed: Fatigue/lethargy limiting ability to participate (Pt asleep upon OT arrival with RN requesting OT allow pt to sleep at this time. OT to reattempt to see pt at a later time as appropriate/available.)  Margarie Rockey HERO., OTR/L, MA Acute Rehab [phone]   Margarie FORBES Horns 12/03/2024, 10:06 AM

## 2024-12-03 NOTE — Progress Notes (Signed)
 eLink Physician-Brief Progress Note Patient Name: Marie Sandoval DOB: Mar 16, 1946 MRN: [id number]   Date of Service  12/03/2024  HPI/Events of Note  Safety sitter order renewed.  eICU Interventions          Marcellina RAYMOND Dub 12/03/2024, 6:33 AM

## 2024-12-04 LAB — RETICULOCYTES
Immature Retic Fract: 15.1 % (ref 2.3–15.9)
RBC.: 3.38 MIL/uL — ABNORMAL LOW (ref 3.87–5.11)
Retic Count, Absolute: 56.8 10*3/uL (ref 19.0–186.0)
Retic Ct Pct: 1.7 % (ref 0.4–3.1)

## 2024-12-04 LAB — CBC
HCT: 32.8 % — ABNORMAL LOW (ref 36.0–46.0)
Hemoglobin: 11 g/dL — ABNORMAL LOW (ref 12.0–15.0)
MCH: 33.5 pg (ref 26.0–34.0)
MCHC: 33.5 g/dL (ref 30.0–36.0)
MCV: 100 fL (ref 80.0–100.0)
Platelets: 106 10*3/uL — ABNORMAL LOW (ref 150–400)
RBC: 3.28 MIL/uL — ABNORMAL LOW (ref 3.87–5.11)
RDW: 14.7 % (ref 11.5–15.5)
WBC: 3.3 10*3/uL — ABNORMAL LOW (ref 4.0–10.5)
nRBC: 0 % (ref 0.0–0.2)

## 2024-12-04 LAB — BASIC METABOLIC PANEL WITH GFR
Anion gap: 10 (ref 5–15)
BUN: 14 mg/dL (ref 8–23)
CO2: 23 mmol/L (ref 22–32)
Calcium: 9.2 mg/dL (ref 8.9–10.3)
Chloride: 107 mmol/L (ref 98–111)
Creatinine, Ser: 1.11 mg/dL — ABNORMAL HIGH (ref 0.44–1.00)
GFR, Estimated: 51 mL/min — ABNORMAL LOW
Glucose, Bld: 123 mg/dL — ABNORMAL HIGH (ref 70–99)
Potassium: 3.8 mmol/L (ref 3.5–5.1)
Sodium: 140 mmol/L (ref 135–145)

## 2024-12-04 LAB — FERRITIN: Ferritin: 115 ng/mL (ref 11–307)

## 2024-12-04 LAB — IRON AND TIBC
Iron: 48 ug/dL (ref 28–170)
Saturation Ratios: 21 % (ref 10.4–31.8)
TIBC: 231 ug/dL — ABNORMAL LOW (ref 250–450)
UIBC: 183 ug/dL

## 2024-12-04 LAB — GLUCOSE, CAPILLARY
Glucose-Capillary: 128 mg/dL — ABNORMAL HIGH (ref 70–99)
Glucose-Capillary: 138 mg/dL — ABNORMAL HIGH (ref 70–99)

## 2024-12-04 LAB — VITAMIN B12: Vitamin B-12: 398 pg/mL (ref 180–914)

## 2024-12-04 LAB — FOLATE: Folate: 4.7 ng/mL — ABNORMAL LOW

## 2024-12-04 MED ORDER — FOLIC ACID 1 MG PO TABS
1.0000 mg | ORAL_TABLET | Freq: Every day | ORAL | Status: DC
  Administered 2024-12-04: 1 mg via ORAL
  Filled 2024-12-04: qty 1

## 2024-12-04 MED ORDER — AMLODIPINE BESYLATE 5 MG PO TABS
5.0000 mg | ORAL_TABLET | Freq: Every day | ORAL | Status: DC
  Administered 2024-12-04: 5 mg via ORAL
  Filled 2024-12-04: qty 1

## 2024-12-04 NOTE — Discharge Summary (Signed)
 Resident Discharge Summary  Patient ID: Marie Sandoval MRN: [id number] DOB/AGE: [birth year] 79 y.o.  Admit date: 12/02/2024 Discharge date: 12/04/2024  Problem List Principal Problem:   SDH (subdural hematoma) (HCC)  HPI: Patient is a 79 yo F w/ pertinent PMH HTN, T2DM, Hypothyroidism, dementia, schizoaffective disorder, Copd presents to [facility] on 2/4 w/ SDH.   Patient recently admitted to [facility] 1/16 w/ multiple falls. CT head negative for acute abormality; did have right posterior parietal scalp laceration requiring staples. Homes benzos dc'd as likely contributing to fall risk. Discharged to SNF.    On 2/4 patient resides at [facility]. Found on her back in bathroom. Not on thinners. AO. EMS called and transferred to [facility] ED. On arrival normotensive. CT head showing left SDH 6mm and 3 mm rightward midline shift. NSG consulted. No surgical needs for now. SBP goal <140 and repeat CT head in 6 hours. Recommending transfer to [facility] icu overnight for admission. PCCM consulted.   Reviewed [facility] the [facility] on 11/13/2024; the following medications were stopped: Klonopin  1 mg TID PRN and Temazepam  15 mg nightly. However, by SNF [facility], these medications were resumed after discharge and prior to readmission.  Hospital Course: 2/4 admit w/ sdh > Tranferred to [facility] 70M ICU for continued monitoring 2/4 zyprexza for agitation; CT head repeat at 6 hrs unchanged 2/4 Family opted for no MMA embolization; NGSY signed off. PT / OT without further recommendations  Hospital problems: Left subdural hematoma 2/2 to fall 3mm midline shift 3mm mildiline shift, unchanged on repeat CT Head at 6HRs. Mental status and neuro exam unchanged. NSGY offered MMA embolization; family declined.Bedside swallow passed. Per PT/OT patient can return to long term SNF. No activity restriction  Schizoaffective disorder Dementia Fibromyalgia Insomnia Continue on PTA medications. Will need to be restarted on  Temazepan if needed upon arrival SNF  HTN SBP to remain <140. Held PTA antihypertensives. Will resume amlodipine  at discharge. Will need further assessment for re initiation of PTA antihypertensives  GI Diarrhea on admission Stool output, 2 episodes over 24 hrs. Will pause PTA bowel management until re-asessment by SNF provider.  DM Held PTA diabetes medications. Okay to resume at follow up at SNF  Hypothyroidism - Resumed on home Levothyroxine   CKD3 Stable Cr 1.11. Normal BUN  Macrocytic anemia - Started on folate supplementation - Stable Hgb at 11  Thrombocytopenia Chronic. 106 stable. Dr. Johnston problem based presentation in this order     Labs at discharge Lab Results  Component Value Date   CREATININE 1.11 (H) 12/04/2024   BUN 14 12/04/2024   NA 140 12/04/2024   K 3.8 12/04/2024   CL 107 12/04/2024   CO2 23 12/04/2024   Lab Results  Component Value Date   WBC 3.3 (L) 12/04/2024   HGB 11.0 (L) 12/04/2024   HCT 32.8 (L) 12/04/2024   MCV 100.0 12/04/2024   PLT 106 (L) 12/04/2024   Lab Results  Component Value Date   ALT 10 12/02/2024   AST 53 (H) 12/02/2024   ALKPHOS 142 (H) 12/02/2024   BILITOT 0.5 12/02/2024   Lab Results  Component Value Date   INR 1.0 12/02/2024   INR 1.1 08/29/2024    Current radiology studies CT HEAD WO CONTRAST ( ) Result Date: 12/02/2024 CLINICAL DATA:  Follow-up subdural hematoma. EXAM: CT HEAD WITHOUT CONTRAST TECHNIQUE: Contiguous axial images were obtained from the base of the skull through the vertex without intravenous contrast. RADIATION DOSE REDUCTION: This exam was performed according to  the departmental dose-optimization program which includes automated exposure control, adjustment of the mA and/or kV according to patient size and/or use of iterative reconstruction technique. COMPARISON:  Head CT earlier today FINDINGS: Brain: Stable mixed density left subdural hematoma measuring 5-6 mm. 3 mm left right midline shift  is also unchanged. Unchanged mild mass effect on the subjacent brain parenchyma. No new or progressive hemorrhage. No hydrocephalus, the basilar cisterns are patent. Stable atrophy and chronic small vessel ischemia. Vascular: Atherosclerosis of skullbase vasculature without hyperdense vessel or abnormal calcification. Skull: Right parietal scalp edema with overlying skin staples. No skull fracture. Sinuses/Orbits: No acute findings. Other: None. IMPRESSION: 1. Stable mixed density left subdural hematoma measuring 5-6 mm. Unchanged 3 mm left right midline shift. 2. No new or progressive hemorrhage. Electronically Signed   By: Andrea Gasman M.D.   On: 12/02/2024 21:25    Disposition:  Discharge disposition: 03-Skilled Nursing Facility       Discharge Instructions     Call MD for:  difficulty breathing, headache or visual disturbances   Complete by: As directed    Or changes in her neurologic status   Call MD for:  severe uncontrolled pain   Complete by: As directed    Call MD for:  temperature >100.4   Complete by: As directed    Increase activity slowly   Complete by: As directed    No wound care   Complete by: As directed       Allergies as of 12/04/2024       Reactions   Buprenorphine Itching, Swelling   Gabapentin Swelling   Generalized Lower Extremity Edema with Weight Gain   Oxycodone-aspirin Hives, Itching, Swelling   Lips and tongue   Sulfamethoxazole Anaphylaxis   Tongue, facial swelling   Oxycodone Swelling   Lips and tongue   Sulfa Antibiotics Dermatitis, Itching        Medication List     PAUSE taking these medications    bisacodyl 10 MG suppository Wait to take this until your doctor or other care provider tells you to start again. Commonly known as: DULCOLAX Place 10 mg rectally daily as needed (constipation not relieved by mom).   ENEMA RE Wait to take this until your doctor or other care provider tells you to start again. Place 1 Dose rectally  daily as needed (constipation no relieved by dulcolax).   glipiZIDE  10 MG tablet Wait to take this until your doctor or other care provider tells you to start again. Commonly known as: GLUCOTROL  Take 1 tablet by mouth daily.   lisinopril  5 MG tablet Wait to take this until your doctor or other care provider tells you to start again. Commonly known as: ZESTRIL  Take 1 tablet by mouth daily.   loperamide 2 MG capsule Wait to take this until your doctor or other care provider tells you to start again. Commonly known as: IMODIUM Take 2 mg by mouth every 6 (six) hours as needed for diarrhea or loose stools.   melatonin 5 MG Tabs Wait to take this until your doctor or other care provider tells you to start again. Take 5 mg by mouth at bedtime.   Milk of Magnesia 1200 MG/15ML suspension Wait to take this until your doctor or other care provider tells you to start again. Generic drug: magnesium hydroxide Take 30 mLs by mouth daily as needed (no BM in 3 days).   polyethylene glycol 17 g packet Wait to take this until your doctor or other care  provider tells you to start again. Commonly known as: MIRALAX  / GLYCOLAX  Take 17 g by mouth daily as needed for mild constipation.   temazepam  15 MG capsule Wait to take this until your doctor or other care provider tells you to start again. Commonly known as: RESTORIL  Take 1 capsule by mouth at bedtime.       STOP taking these medications    UNABLE TO FIND       TAKE these medications    acetaminophen  500 MG tablet Commonly known as: TYLENOL  Take 1,000 mg by mouth in the morning, at noon, and at bedtime.   albuterol  108 (90 Base) MCG/ACT inhaler Commonly known as: VENTOLIN  HFA Inhale 2 puffs into the lungs every 6 (six) hours as needed for wheezing.   amLODipine  5 MG tablet Commonly known as: NORVASC  Take 1 tablet by mouth daily.   Anbesol Cold Sore Therapy Oint Apply 1 Application topically in the morning and at bedtime.  Apply to lower right lip   azelastine 0.05 % ophthalmic solution Commonly known as: OPTIVAR INSTILL 1 DROP INTO EACH EYE TWICE DAILY   clonazePAM  1 MG tablet Commonly known as: KLONOPIN  Take 1 mg by mouth in the morning, at noon, and at bedtime.   clonazePAM  1 MG tablet Commonly known as: KLONOPIN  Take 1 mg by mouth daily as needed (agitation).   fluticasone 50 MCG/ACT nasal spray Commonly known as: FLONASE 2 sprays by Each Nare route 2 times daily.   Jardiance 25 MG Tabs tablet Generic drug: empagliflozin Take 25 mg by mouth daily.   levothyroxine  75 MCG tablet Commonly known as: SYNTHROID  Take 75 mcg by mouth in the morning.   montelukast  10 MG tablet Commonly known as: SINGULAIR  Take 10 mg by mouth every evening.   omeprazole 40 MG capsule Commonly known as: PRILOSEC Take 40 mg by mouth in the morning and at bedtime.   QUEtiapine  100 MG tablet Commonly known as: SEROQUEL  Take 100 mg by mouth 2 (two) times daily.   QUEtiapine  50 MG tablet Commonly known as: SEROQUEL  Take 50 mg by mouth daily in the afternoon.   saccharomyces boulardii 250 MG capsule Commonly known as: FLORASTOR Take 250 mg by mouth daily.   Sertraline  HCl 150 MG Caps Take 150 mg by mouth daily.   Tradjenta  5 MG Tabs tablet Generic drug: linagliptin  Take 1 tablet by mouth daily.   traMADol 50 MG tablet Commonly known as: ULTRAM Take 50 mg by mouth 3 (three) times daily.   traZODone  100 MG tablet Commonly known as: DESYREL  Take 100 mg by mouth at bedtime.   Voltaren 1 % Gel Generic drug: diclofenac Sodium Apply 2 g topically in the morning, at noon, and at bedtime. Apply to right knee and shoulder        Contact information for after-discharge care     Destination     [facility] .   Service: Skilled Nursing Contact information: [address] [place]  [address] [phone]                     Discharged Condition: fair  Time spent  on discharge greater than 40 minutes.  Vital signs at Discharge. Temp:  [98 F (36.7 C)-99 F (37.2 C)] 98.8 F (37.1 C) (02/06 0712) Pulse Rate:  [71-81] 81 (02/05 1600) Resp:  [13-22] 18 (02/06 0900) BP: (102-148)/(45-96) 148/65 (02/06 0900) SpO2:  [95 %-98 %] 97 % (02/06 0900) Weight:  [56.6 kg] 56.6 kg (02/06  53) General: Pleasant, well-appearing elderly woman laying in bed. No acute distress. Head: Normocephalic. Atraumatic. CV: RRR. No murmurs, rubs, or gallops. No LE edema Pulmonary: Lungs CTAB. Normal effort. No wheezing or rales. Abdominal: Soft, nontender, nondistended. Normal bowel sounds. Extremities: Palpable radial and DP pulses Skin: Warm and dry. R forehead bruising  Neuro: A&O to self. PEERLA, midline tongue protrusion. Moves all extremities. Normal sensation. No focal deficit. Psych: anxious mood with mild paranoia  Office follow up Special Information or instructions. Signed:  Hadassah Kristy Ahr, MD Internal Medicine, PGY-3 12/04/2024, 10:34 AM [phone]

## 2024-12-04 NOTE — Progress Notes (Signed)
 OT Cancellation Note - OT Screen  Patient Details Name: Marie Sandoval MRN: [id number] DOB: 1946-04-05   Cancelled Treatment:    Reason Eval/Treat Not Completed: OT screened, no needs identified, will sign off (New OT eval order received. OT screen completed. Pt performing ADLs and mobility with Sup with cues for sequencing, safety, and thoughroughness, which is pt's baseline per chart review and RN report. No acute OT needs identified and no post-acute skilled OT needs anticipated. OT signing off at this time.)  Margarie Rockey HERO., OTR/L, MA Acute Rehab [phone]   Margarie FORBES Horns 12/04/2024, 11:48 AM

## 2024-12-04 NOTE — Progress Notes (Signed)
 PT Cancellation Note  Patient Details Name: Marcianne Ozbun MRN: [id number] DOB: 21-Mar-1946   Cancelled Treatment:    Reason Eval/Treat Not Completed: Patient at procedure or test/unavailable. Pt transferring onto stretcher with assistance from EMS and RN for PTAR transport in preparation for discharge back to [facility].  Isaiah DEL. Keshana Klemz, PT, DPT  [employer] 12/04/2024, 1:17 PM

## 2024-12-04 NOTE — TOC Transition Note (Signed)
 Transition of Care [facility]) - Discharge Note   Patient Details  Name: Marie Sandoval MRN: [id number] Date of Birth: 07/29/1946  Transition of Care [facility]) CM/SW Contact:  Gwenn Julien Norris, [place] Phone Number: 12/04/2024, 10:53 AM   Clinical Narrative: Pt for dc back to [facility] where she is a LTC/SNF resident. Spoke to [place] in admissions who confirmed they are prepared to admit pt to room 205. Pt's dtr Asberry aware of dc and reports agreeable. RN provided with number for report and [facility] arranged for transport. SW signing off at dc.   Julien Gwenn, MSW, LCSW [phone] (coverage)        Final next level of care: Skilled Nursing Facility Barriers to Discharge: Barriers Resolved   Patient Goals and CMS Choice            Discharge Placement              Patient chooses bed at: [facility] Patient to be transferred to facility by: [facility] Name of family member notified: Marie Sandoval/Dtr Patient and family notified of of transfer: 12/04/24  Discharge Plan and Services Additional resources added to the After Visit Summary for       Post Acute Care Choice: Skilled Nursing Facility                               Social Drivers of Health (SDOH) Interventions SDOH Screenings   Food Insecurity: Unknown (11/13/2024)   Received from [facility]  Utilities: Unknown (11/13/2024)   Received from [facility]  Financial Resource Strain: Patient Declined (11/13/2024)   Received from [facility]  Social Connections: Unknown (11/13/2024)   Received from [facility]  Tobacco Use: Low Risk (06/02/2024)     Readmission Risk Interventions     No data to display

## 2024-12-04 NOTE — Plan of Care (Signed)
 Report was called to Myra Deanne Muckle, RN, all question were answered. According to facility patient is back to her baseline. I reviewed medications that were given to patient. I printed off AVS, Patient Demographics, Discharge Summary, and Transition of care. Patient will leave in hospital gown with personal gown in a hospital bag and wearing glasses. Daughter Asberry called and message was left.
